# Patient Record
Sex: Female | Born: 1980 | Race: Asian | Hispanic: No | Marital: Married | Smoking: Never smoker
Health system: Southern US, Community
[De-identification: ages and names within clinical notes are randomized; demographics above are authoritative.]

## PROBLEM LIST (undated history)

## (undated) DIAGNOSIS — E119 Type 2 diabetes mellitus without complications: Secondary | ICD-10-CM

## (undated) DIAGNOSIS — O24419 Gestational diabetes mellitus in pregnancy, unspecified control: Secondary | ICD-10-CM

## (undated) DIAGNOSIS — O139 Gestational [pregnancy-induced] hypertension without significant proteinuria, unspecified trimester: Secondary | ICD-10-CM

## (undated) HISTORY — DX: Type 2 diabetes mellitus without complications: E11.9

---

## 2005-05-24 ENCOUNTER — Emergency Department (HOSPITAL_COMMUNITY): Admission: EM | Admit: 2005-05-24 | Discharge: 2005-05-24 | Payer: Self-pay | Admitting: Emergency Medicine

## 2008-12-03 ENCOUNTER — Inpatient Hospital Stay (HOSPITAL_COMMUNITY): Admission: AD | Admit: 2008-12-03 | Discharge: 2008-12-03 | Payer: Self-pay | Admitting: Obstetrics & Gynecology

## 2009-01-07 ENCOUNTER — Inpatient Hospital Stay (HOSPITAL_COMMUNITY): Admission: AD | Admit: 2009-01-07 | Discharge: 2009-01-11 | Payer: Self-pay | Admitting: Obstetrics and Gynecology

## 2009-01-08 ENCOUNTER — Encounter (INDEPENDENT_AMBULATORY_CARE_PROVIDER_SITE_OTHER): Payer: Self-pay | Admitting: Obstetrics & Gynecology

## 2009-04-15 ENCOUNTER — Inpatient Hospital Stay (HOSPITAL_COMMUNITY): Admission: AD | Admit: 2009-04-15 | Discharge: 2009-04-15 | Payer: Self-pay | Admitting: Obstetrics and Gynecology

## 2011-02-02 LAB — COMPREHENSIVE METABOLIC PANEL
ALT: 26 U/L (ref 0–35)
AST: 27 U/L (ref 0–37)
Albumin: 2.7 g/dL — ABNORMAL LOW (ref 3.5–5.2)
Alkaline Phosphatase: 231 U/L — ABNORMAL HIGH (ref 39–117)
BUN: 11 mg/dL (ref 6–23)
CO2: 22 mEq/L (ref 19–32)
Calcium: 9 mg/dL (ref 8.4–10.5)
Chloride: 104 mEq/L (ref 96–112)
Creatinine, Ser: 0.68 mg/dL (ref 0.4–1.2)
GFR calc Af Amer: >60 mL/min (ref >60)
GFR calc non Af Amer: >60 mL/min (ref >60)
Glucose, Bld: 85 mg/dL (ref 70–99)
Potassium: 3.8 mEq/L (ref 3.5–5.1)
Sodium: 134 mEq/L — ABNORMAL LOW (ref 135–145)
Total Bilirubin: 0.3 mg/dL (ref 0.3–1.2)
Total Protein: 6 g/dL (ref 6.0–8.3)

## 2011-02-02 LAB — RPR: RPR Ser Ql: NONREACTIVE

## 2011-02-02 LAB — CBC
HCT: 41.4 % (ref 36.0–46.0)
Hemoglobin: 13.8 g/dL (ref 12.0–15.0)
MCHC: 33.2 g/dL (ref 30.0–36.0)
MCHC: 33.6 g/dL (ref 30.0–36.0)
MCV: 89.5 fL (ref 78.0–100.0)
MCV: 89.8 fL (ref 78.0–100.0)
Platelets: 219 10*3/uL (ref 150–400)
RBC: 2.93 MIL/uL — ABNORMAL LOW (ref 3.87–5.11)
RBC: 4.53 MIL/uL (ref 3.87–5.11)
RBC: 4.63 MIL/uL (ref 3.87–5.11)
RDW: 14.8 % (ref 11.5–15.5)
WBC: 11.2 10*3/uL — ABNORMAL HIGH (ref 4.0–10.5)
WBC: 16 10*3/uL — ABNORMAL HIGH (ref 4.0–10.5)

## 2011-02-02 LAB — URIC ACID: Uric Acid, Serum: 6.4 mg/dL (ref 2.4–7.0)

## 2011-02-02 LAB — LACTATE DEHYDROGENASE: LDH: 208 U/L (ref 94–250)

## 2011-02-07 LAB — URINALYSIS, ROUTINE W REFLEX MICROSCOPIC
Glucose, UA: 100 mg/dL — AB
Ketones, ur: NEGATIVE mg/dL
pH: 8.5 — ABNORMAL HIGH (ref 5.0–8.0)

## 2011-02-07 LAB — URINE CULTURE
Colony Count: NO GROWTH
Culture: NO GROWTH

## 2011-03-07 NOTE — Op Note (Signed)
NAME:  Janice Case, Janice Case             ACCOUNT NO.:  [id number]   MEDICAL RECORD NO.:  [id number]          PATIENT TYPE:  INP   LOCATION:  9168                          FACILITY:  [facility]   PHYSICIAN:  Genia Del, M.D.DATE OF BIRTH:  02/16/1981   DATE OF PROCEDURE:  01/08/2009  DATE OF DISCHARGE:                               OPERATIVE REPORT   PREOPERATIVE DIAGNOSES:  39 weeks' gestation, arrest of descent in  second stage.   POSTOPERATIVE DIAGNOSES:  39 weeks' gestation, arrest of descent in  second stage.   PROCEDURE:  Urgent primary low transverse cesarean section.   SURGEON:  Genia Del, MD   ASSISTANT:  None.   ANESTHESIOLOGIST:  Quillian Quince, MD   PROCEDURE IN DETAIL:  Under spinal anesthesia, the patient is in 15-  degrees left decubitus position.  She was prepped with Betadine on the  abdominal, suprapubic, and vulvar areas.  The Foley is already in place  in the bladder.  We draped the patient as usual.  We verified the level  of anesthesia, which was adequate.  We infiltrated the subcutaneous  tissue with Marcaine 0.25% plain 10 mL.  We made a Pfannenstiel incision  with a scalpel, opened the adipose tissue with the electrocautery,  opened the aponeurosis transversely with the electrocautery and then  Mayo scissors.  We separated the aponeurosis from the recti muscles on  the midline, anteriorly, superiorly, and inferiorly.  We opened the  parietal peritoneum longitudinally with [facility] scissors.  We then put the  bladder retractor in place, opened the visceral peritoneum over the  lower uterine segment transversely, reclined the bladder downward,  repositioned the bladder retractor.  We then made a low transverse  hysterotomy with the scalpel.  We extended on each side with dressing  scissors.  The fetus was in cephalic presentation.  The amniotic fluid  was clear.  Birth of a baby girl at 4:33 a.m.  The cord was clamped and  cut.  The baby was suctioned  and given to the neonatal team.  Apgars  were 9 and 9, weight was 7 pounds and 15 ounces.  We extracted the  placenta, it was complete with 3 vessels, it was sent to Pathology.  We  did a uterine revision.  Pitocin was started in the IV fluid.  The  uterus contracts well.  Both ovaries and both tubes were normal to  inspection.  The uterus was also normal in size and appearance.  We  closed the hysterotomy with a first-locked running suture of Vicryl 0, a  small extension was present over the left aspect of the incision, and it  was repaired and then continued over the transverse part.  We then made  a second layer in a mattress fashion with Vicryl 0, that completes  hemostasis.  We then irrigated and suctioned the abdominopelvic  cavities.  We completed hemostasis on the recti muscles and over the  bladder area.  We then closed the aponeurosis with 2 half-running  sutures of Vicryl 0.  We completed the hemostasis on the adipose tissue  with the electrocautery and reapproximate  the skin with staples.  Dry  dressing was applied.  Note that the patient  received a dose of Ancef 1 g IV at the beginning of the intervention.  The estimated blood loss was 600 mL.  No complications occurred, and the  patient was brought to recovery room in good stable status.  The count  of instruments and sponges were complete x2.      Genia Del, M.D.  Electronically Signed     ML/MEDQ  D:  01/08/2009  T:  01/08/2009  Job:  [id number]

## 2011-03-07 NOTE — Discharge Summary (Signed)
NAME:  Janice Case, Janice Case             ACCOUNT NO.:  [id number]   MEDICAL RECORD NO.:  [id number]          PATIENT TYPE:  INP   LOCATION:  9108                          FACILITY:  [facility]   PHYSICIAN:  Genia Del, M.D.DATE OF BIRTH:  1981-06-18   DATE OF ADMISSION:  01/07/2009  DATE OF DISCHARGE:  01/11/2009                               DISCHARGE SUMMARY   ADMISSION DIAGNOSES:  1. Gravida 1, para 0 at 39 weeks intrauterine pregnancy.  2. Latent labor after spontaneous rupture of membranes.  3. Group B strep positive for mild preeclampsia.   DISCHARGE DIAGNOSES:  1. Gravida 1, para 1, primarily low transverse cesarean section,      postop day #3, term gestation.  2. Mild preeclampsia.  3. Acute blood loss anemia.   PROCEDURE:  Primary low-transverse cesarean section.   HISTORY OF PRESENT ILLNESS:  A 30 year old Asian female gravida 1, para  1-0-0-1, married at term presented in latent early labor after  spontaneous rupture of membranes.   HOSPITAL COURSE:  The patient was admitted.  She received group B strep  prophylaxis with penicillin 5 million units IV piggyback, then 2.5  million unit q.4 h. intrapartum.  Her mild preeclampsia did not require  the administration of magnesium sulfate in labor.  After pushing  approximately 3 hours with minimal descent,  she was taken to the  operating room for a primary low tranverse cesarean section.  She  delivered a live female with spontaneous respirations.  At 0433, Apgars  were 8 and 9.  The baby weight was 7 pounds and 15 ounces.  The patient  had an uncomplicated postoperative course.  She did not require  magnesium sulfate administration for the mild preeclampsia.  Her CBC on  postop day #1 showed a hemoglobin of 8.9, hematocrit of 26.3, platelet  count 157,000, white blood cell count 16.0.  By postop day #3, the  patient was doing well and ready to go home.   DISPOSITION:  To home.   CONDITION:  Stable.   DISCHARGE  MEDICATIONS:  1. Motrin 600 mg p.o. q.6 h. p.r.n.  2. Percocet 5/325 one to two tablets p.o. q. 4-6 h. p.r.n.  3. Colace 100 mg 1 tablet twice a day as needed.  4. Ferrous sulfate 325 mg p.o. 1 tablet daily.   FOLLOWUP APPOINTMENT:  At [facility] in 6 weeks.   DISCHARGE INSTRUCTIONS:  Per the postpartum booklet given.      Merrilee Jansky, CNM      Genia Del, M.D.  Electronically Signed    DL/MEDQ  D:  16/07/9603  T:  01/12/2009  Job:  [id number]

## 2012-06-18 ENCOUNTER — Ambulatory Visit (INDEPENDENT_AMBULATORY_CARE_PROVIDER_SITE_OTHER): Payer: Managed Care, Other (non HMO) | Admitting: Family Medicine

## 2012-06-18 VITALS — BP 103/69 | HR 73 | Temp 98.2°F | Resp 16 | Ht 58.25 in | Wt 118.2 lb

## 2012-06-18 DIAGNOSIS — R197 Diarrhea, unspecified: Secondary | ICD-10-CM

## 2012-06-18 DIAGNOSIS — K529 Noninfective gastroenteritis and colitis, unspecified: Secondary | ICD-10-CM

## 2012-06-18 DIAGNOSIS — R109 Unspecified abdominal pain: Secondary | ICD-10-CM

## 2012-06-18 DIAGNOSIS — K5289 Other specified noninfective gastroenteritis and colitis: Secondary | ICD-10-CM

## 2012-06-18 DIAGNOSIS — R1110 Vomiting, unspecified: Secondary | ICD-10-CM

## 2012-06-18 LAB — POCT URINE PREGNANCY: Preg Test, Ur: NEGATIVE

## 2012-06-18 LAB — POCT CBC
Granulocyte percent: 89.3 %G — AB (ref 37–80)
HCT, POC: 46.9 % (ref 37.7–47.9)
MCV: 90.5 fL (ref 80–97)
MID (cbc): 0.6 (ref 0–0.9)
Platelet Count, POC: 296 10*3/uL (ref 142–424)
RBC: 5.18 M/uL (ref 4.04–5.48)

## 2012-06-18 LAB — POCT URINALYSIS DIPSTICK
Leukocytes, UA: NEGATIVE
Protein, UA: 30
Urobilinogen, UA: 0.2

## 2012-06-18 LAB — POCT UA - MICROSCOPIC ONLY
Casts, Ur, LPF, POC: NEGATIVE
Crystals, Ur, HPF, POC: NEGATIVE

## 2012-06-18 MED ORDER — ONDANSETRON 4 MG PO TBDP: ORAL_TABLET | ORAL | Status: DC

## 2012-06-18 NOTE — Progress Notes (Signed)
Subjective: 31 year old lady from [place] who speaks good [place]. She got sick today and twice vomited and had diarrhea she's had no urinary pain. She hurts in her abdomen, it has let up a little bit right now. Her last menstrual cycle was July 2. 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 Married with one child. Her child has been sick with a gastroenteritis epistaxis. They went to the hospital yesterday.  Objective Results for orders placed in visit on 06/18/12  POCT UA - MICROSCOPIC ONLY      Component Value Range   WBC, Ur, HPF, POC 3-5     RBC, urine, microscopic 0-3     Bacteria, U Microscopic 4+     Mucus, UA neg     Epithelial cells, urine per micros 15-20     Crystals, Ur, HPF, POC neg     Casts, Ur, LPF, POC neg     Yeast, UA neg    POCT URINALYSIS DIPSTICK      Component Value Range   Color, UA dark yellow     Clarity, UA cloudy     Glucose, UA neg     Bilirubin, UA neg     Ketones, UA 15     Spec Grav, UA >=1.030     Blood, UA trace     pH, UA 5.5     Protein, UA 30     Urobilinogen, UA 0.2     Nitrite, UA neg     Leukocytes, UA Negative    POCT URINE PREGNANCY      Component Value Range   Preg Test, Ur Negative     HEENT negative eyes PERRLA. TMs normal. Throat clear. Neck supple without nodes. Chest clear. Heart regular without murmurs. The masses or tenderness tenderness. Diminished bowel sounds. Skin warm and dry. No major tenderness of the abdomen.  Assessment: Gastroenteritis with mild dehydration  Plan: Check CBC before deciding treatment. Results for orders placed in visit on 06/18/12  POCT UA - MICROSCOPIC ONLY      Component Value Range   WBC, Ur, HPF, POC 3-5     RBC, urine, microscopic 0-3     Bacteria, U Microscopic 4+     Mucus, UA neg     Epithelial cells, urine per micros 15-20     Crystals, Ur, HPF, POC neg     Casts, Ur, LPF, POC neg     Yeast, UA neg    POCT URINALYSIS DIPSTICK      Component Value Range   Color, UA dark yellow       Clarity, UA cloudy     Glucose, UA neg     Bilirubin, UA neg     Ketones, UA 15     Spec Grav, UA >=1.030     Blood, UA trace     pH, UA 5.5     Protein, UA 30     Urobilinogen, UA 0.2     Nitrite, UA neg     Leukocytes, UA Negative    POCT URINE PREGNANCY      Component Value Range   Preg Test, Ur Negative    POCT CBC      Component Value Range   WBC 15.2 (*) 4.6 - 10.2 K/uL   Lymph, poc 1.0  0.6 - 3.4   POC LYMPH PERCENT 6.7 (*) 10 - 50 %L   MID (cbc) 0.6  0 - 0.9   POC MID % 4.0  0 - 12 %  M   POC Granulocyte 13.6 (*) 2 - 6.9   Granulocyte percent 89.3 (*) 37 - 80 %G   RBC 5.18  4.04 - 5.48 M/uL   Hemoglobin 15.1  12.2 - 16.2 g/dL   HCT, POC 16.1  09.6 - 47.9 %   MCV 90.5  80 - 97 fL   MCH, POC 29.2  27 - 31.2 pg   MCHC 32.2  31.8 - 35.4 g/dL   RDW, POC 04.5     Platelet Count, POC 296  142 - 424 K/uL   MPV 9.8  0 - 99.8 fL   Probably has leukocytosis from the gastro.  Her son was also sick.  However, will caution to go to ER or return if worse.

## 2012-06-18 NOTE — Progress Notes (Deleted)
Subjective

## 2012-06-18 NOTE — Patient Instructions (Signed)
Take the medicine for nausea and vomiting if necessary.  If you develop worse abdominal pain, high fever or bad vomiting go to the emergency room.  If you are not improving by tomorrow  come back for a recheck and repeat blood test.

## 2013-03-15 ENCOUNTER — Encounter (HOSPITAL_COMMUNITY): Payer: Self-pay | Admitting: *Deleted

## 2013-03-15 ENCOUNTER — Inpatient Hospital Stay (HOSPITAL_COMMUNITY)
Admission: AD | Admit: 2013-03-15 | Discharge: 2013-03-15 | Disposition: A | Discharge status: Home / Self Care | Payer: Managed Care, Other (non HMO) | Source: Ambulatory Visit | Attending: Obstetrics & Gynecology | Admitting: Obstetrics & Gynecology

## 2013-03-15 DIAGNOSIS — O99891 Other specified diseases and conditions complicating pregnancy: Secondary | ICD-10-CM | POA: Insufficient documentation

## 2013-03-15 DIAGNOSIS — Y92009 Unspecified place in unspecified non-institutional (private) residence as the place of occurrence of the external cause: Secondary | ICD-10-CM | POA: Insufficient documentation

## 2013-03-15 DIAGNOSIS — W010XXA Fall on same level from slipping, tripping and stumbling without subsequent striking against object, initial encounter: Secondary | ICD-10-CM | POA: Insufficient documentation

## 2013-03-15 LAB — POCT PREGNANCY, URINE: Preg Test, Ur: POSITIVE — AB

## 2013-03-15 NOTE — MAU Note (Addendum)
Pt slipped and fell on her buttocks today. Worried about baby. Denies any pain or bleeding. Did not hit stomach. Has first prenatal appointment end of this month.

## 2013-03-15 NOTE — MAU Provider Note (Signed)
  History     CSN: [id number]  Arrival date and time: 03/15/13 1325   First Provider Initiated Contact with Patient 03/15/13 1514      Chief Complaint  Patient presents with  . Fall   HPI Janice Case 32 y.o. LMP 01-15-13.  8w 3d  Client has an appointment to begin prenatal care at [facility].  Medicaid is pending.  Today slipped on a slick floor at home and caught herself with her hands.  Buttocks did not actually hit the floor.  Denies any abdominal pain.  No vaginal bleeding or leaking.  Came to make sure the baby is OK.  OB History   Grav Para Term Preterm Abortions TAB SAB Ect Mult Living   2 1 1       1       Past Medical History  Diagnosis Date  . Medical history non-contributory     Past Surgical History  Procedure Laterality Date  . Cesarean section      No family history on file.  History  Substance Use Topics  . Smoking status: Never Smoker   . Smokeless tobacco: Not on file  . Alcohol Use: No    Allergies: No Known Allergies  Prescriptions prior to admission  Medication Sig Dispense Refill  . Prenatal Vit-Fe Fumarate-FA (PRENATAL MULTIVITAMIN) TABS Take 1 tablet by mouth daily at 12 noon.        Review of Systems  Constitutional: Negative for fever.  Gastrointestinal: Negative for nausea, vomiting and abdominal pain.  Genitourinary: Negative for dysuria.       No leaking or bleeding   Physical Exam   Blood pressure 104/66, pulse 72, temperature 98.7 F (37.1 C), temperature source Oral, resp. rate 18, height 4\' 10"  (1.473 m), weight 117 lb 9.6 oz (53.343 kg), last menstrual period 01/15/2013.  Physical Exam  Nursing note and vitals reviewed. Constitutional: She is oriented to person, place, and time. She appears well-developed and well-nourished.  HENT:  Head: Normocephalic.  Eyes: EOM are normal.  Neck: Neck supple.  Musculoskeletal: Normal range of motion.  Neurological: She is alert and oriented to person, place, and time.  Skin:  Skin is warm and dry.  Psychiatric: She has a normal mood and affect.    MAU Course  Procedures Results for orders placed during the hospital encounter of 03/15/13 (from the past 24 hour(s))  POCT PREGNANCY, URINE     Status: Abnormal   Collection Time    03/15/13  2:11 PM      Result Value Range   Preg Test, Ur POSITIVE (*) NEGATIVE   MDM Discussed with client that she has no signs of a problem with the pregnancy.  Since she did not actually fall and hit hard, ultrasound is not indicated today.  Assessment and Plan  Near fall at home  Plan Keep appointment for prenatal care. Return if you develop abdominal pain or bleeding.  BURLESON,TERRI 03/15/2013, 3:24 PM

## 2013-03-21 ENCOUNTER — Other Ambulatory Visit: Payer: Self-pay

## 2013-04-11 LAB — OB RESULTS CONSOLE HIV ANTIBODY (ROUTINE TESTING): HIV: NONREACTIVE

## 2013-04-11 LAB — OB RESULTS CONSOLE ABO/RH: RH Type: POSITIVE

## 2013-04-11 LAB — OB RESULTS CONSOLE HEPATITIS B SURFACE ANTIGEN: Hepatitis B Surface Ag: NEGATIVE

## 2013-04-11 LAB — OB RESULTS CONSOLE ANTIBODY SCREEN: Antibody Screen: NEGATIVE

## 2013-04-11 LAB — OB RESULTS CONSOLE RUBELLA ANTIBODY, IGM: Rubella: IMMUNE

## 2013-06-05 ENCOUNTER — Emergency Department (HOSPITAL_COMMUNITY)
Admission: EM | Admit: 2013-06-05 | Discharge: 2013-06-05 | Disposition: A | Discharge status: Home / Self Care | Payer: Managed Care, Other (non HMO) | Attending: Emergency Medicine | Admitting: Emergency Medicine

## 2013-06-05 ENCOUNTER — Encounter (HOSPITAL_COMMUNITY): Payer: Self-pay | Admitting: Emergency Medicine

## 2013-06-05 DIAGNOSIS — R079 Chest pain, unspecified: Secondary | ICD-10-CM | POA: Insufficient documentation

## 2013-06-05 DIAGNOSIS — O9989 Other specified diseases and conditions complicating pregnancy, childbirth and the puerperium: Secondary | ICD-10-CM | POA: Insufficient documentation

## 2013-06-05 DIAGNOSIS — K2970 Gastritis, unspecified, without bleeding: Secondary | ICD-10-CM | POA: Insufficient documentation

## 2013-06-05 DIAGNOSIS — R112 Nausea with vomiting, unspecified: Secondary | ICD-10-CM | POA: Insufficient documentation

## 2013-06-05 DIAGNOSIS — M549 Dorsalgia, unspecified: Secondary | ICD-10-CM | POA: Insufficient documentation

## 2013-06-05 DIAGNOSIS — Z79899 Other long term (current) drug therapy: Secondary | ICD-10-CM | POA: Insufficient documentation

## 2013-06-05 DIAGNOSIS — R109 Unspecified abdominal pain: Secondary | ICD-10-CM | POA: Insufficient documentation

## 2013-06-05 DIAGNOSIS — K2990 Gastroduodenitis, unspecified, without bleeding: Secondary | ICD-10-CM | POA: Insufficient documentation

## 2013-06-05 DIAGNOSIS — R0602 Shortness of breath: Secondary | ICD-10-CM | POA: Insufficient documentation

## 2013-06-05 LAB — BASIC METABOLIC PANEL
CO2: 24 mEq/L (ref 19–32)
Calcium: 8.8 mg/dL (ref 8.4–10.5)
Creatinine, Ser: 0.56 mg/dL (ref 0.50–1.10)
Glucose, Bld: 131 mg/dL — ABNORMAL HIGH (ref 70–99)
Sodium: 134 mEq/L — ABNORMAL LOW (ref 135–145)

## 2013-06-05 LAB — CBC
MCV: 87.4 fL (ref 78.0–100.0)
Platelets: 210 10*3/uL (ref 150–400)
RBC: 3.97 MIL/uL (ref 3.87–5.11)
WBC: 9.4 10*3/uL (ref 4.0–10.5)

## 2013-06-05 LAB — HEPATIC FUNCTION PANEL
AST: 16 U/L (ref 0–37)
Albumin: 2.8 g/dL — ABNORMAL LOW (ref 3.5–5.2)
Total Bilirubin: 0.5 mg/dL (ref 0.3–1.2)

## 2013-06-05 LAB — POCT I-STAT TROPONIN I: Troponin i, poc: 0 ng/mL (ref 0.00–0.08)

## 2013-06-05 MED ORDER — CALCIUM CARBONATE ANTACID 500 MG PO CHEW: 1.0000 | CHEWABLE_TABLET | Freq: Every day | ORAL | Status: DC

## 2013-06-05 MED ORDER — ONDANSETRON 4 MG PO TBDP
8.0000 mg | ORAL_TABLET | Freq: Once | ORAL | Status: AC
  Administered 2013-06-05: 8 mg via ORAL
  Filled 2013-06-05: qty 2

## 2013-06-05 MED ORDER — FAMOTIDINE 20 MG PO TABS
40.0000 mg | ORAL_TABLET | Freq: Once | ORAL | Status: AC
  Administered 2013-06-05: 40 mg via ORAL
  Filled 2013-06-05: qty 2

## 2013-06-05 MED ORDER — PRENATAL COMPLETE 14-0.4 MG PO TABS: 1.0000 | ORAL_TABLET | Freq: Once | ORAL | Status: DC

## 2013-06-05 MED ORDER — FAMOTIDINE 20 MG PO TABS: 20.0000 mg | ORAL_TABLET | Freq: Two times a day (BID) | ORAL | Status: DC

## 2013-06-05 NOTE — ED Provider Notes (Signed)
CSN: [id number]     Arrival date & time 06/05/13  0244 History     First MD Initiated Contact with Patient 06/05/13 [id number]     Chief Complaint  Patient presents with  . chest pain    HPI  History provided by the patient. The patient is a 32 year old female G2P1 currently 5 months pregnant who presents with complaints of persistent and worsened epigastric and substernal chest discomforts. Patient first began having some pain and discomfort yesterday evening around 6 PM. He seemed to increase through the evening and was associated with one episode of vomiting. Pain seems worse at times with deep breathing. She denies any shortness of breath however. She did not take any medications or treatment for symptoms. She denies any increased belching. She denies any lower abdominal pain, vaginal bleeding or vaginal discharge. No urinary symptoms, no dysuria, hematuria urinary frequency. No diarrhea or constipation. No recent fever, chills or sweats. No coughing or URI symptoms. Patient states she has upcoming appointment with OB/GYN on Friday.    Past Medical History  Diagnosis Date  . Medical history non-contributory    Past Surgical History  Procedure Laterality Date  . Cesarean section     No family history on file. History  Substance Use Topics  . Smoking status: Never Smoker   . Smokeless tobacco: Not on file  . Alcohol Use: No   OB History   Grav Para Term Preterm Abortions TAB SAB Ect Mult Living   3 1 1       1      Review of Systems  Constitutional: Negative for fever, chills and diaphoresis.  Respiratory: Negative for cough and shortness of breath.   Cardiovascular: Positive for chest pain.  Gastrointestinal: Positive for nausea and vomiting. Negative for abdominal pain, diarrhea and constipation.  Genitourinary: Negative for dysuria, frequency, hematuria, vaginal bleeding, vaginal discharge and pelvic pain.  All other systems reviewed and are negative.    Allergies  Review  of patient's allergies indicates no known allergies.  Home Medications   Current Outpatient Rx  Name  Route  Sig  Dispense  Refill  . Prenatal Vit-Fe Fumarate-FA (PRENATAL MULTIVITAMIN) TABS   Oral   Take 1 tablet by mouth daily at 12 noon.          BP 125/89  Pulse 92  Temp(Src) 98.9 F (37.2 C) (Oral)  Resp 18  SpO2 100%  LMP 01/15/2013 Physical Exam  Nursing note and vitals reviewed. Constitutional: She is oriented to person, place, and time. She appears well-developed and well-nourished. No distress.  HENT:  Head: Normocephalic.  Cardiovascular: Normal rate and regular rhythm.   No murmur heard. Pulmonary/Chest: Effort normal and breath sounds normal. No respiratory distress. She has no wheezes. She has no rales. She exhibits no tenderness.  Abdominal: Soft. There is no tenderness.  Gravid. No tenderness. Bedside ultrasound shows well-developing fetus with normal heart rate and activity.  Musculoskeletal: Normal range of motion. She exhibits no edema and no tenderness.  Neurological: She is alert and oriented to person, place, and time.  Skin: Skin is warm and dry. No rash noted.  Psychiatric: She has a normal mood and affect. Her behavior is normal.    ED Course   Procedures   Results for orders placed during the hospital encounter of 06/05/13  CBC      Result Value Range   WBC 9.4  4.0 - 10.5 K/uL   RBC 3.97  3.87 - 5.11 MIL/uL   Hemoglobin  12.5  12.0 - 15.0 g/dL   HCT 40.9 (*) 81.1 - 91.4 %   MCV 87.4  78.0 - 100.0 fL   MCH 31.5  26.0 - 34.0 pg   MCHC 36.0  30.0 - 36.0 g/dL   RDW 78.2  95.6 - 21.3 %   Platelets 210  150 - 400 K/uL  BASIC METABOLIC PANEL      Result Value Range   Sodium 134 (*) 135 - 145 mEq/L   Potassium 3.7  3.5 - 5.1 mEq/L   Chloride 100  96 - 112 mEq/L   CO2 24  19 - 32 mEq/L   Glucose, Bld 131 (*) 70 - 99 mg/dL   BUN 9  6 - 23 mg/dL   Creatinine, Ser 0.86  0.50 - 1.10 mg/dL   Calcium 8.8  8.4 - 57.8 mg/dL   GFR calc non Af Amer  >90  >90 mL/min   GFR calc Af Amer >90  >90 mL/min  PRO B NATRIURETIC PEPTIDE      Result Value Range   Pro B Natriuretic peptide (BNP) 94.0  0 - 125 pg/mL  POCT I-STAT TROPONIN I      Result Value Range   Troponin i, poc 0.00  0.00 - 0.08 ng/mL   Comment 3                   1. Gastritis        MDM  4:30 AM patient seen and evaluated. Patient resting in bed appears comfortable no acute distress.  Bedside ultrasound shows normal fetal heart movements. Gallbladder not well visualized.  Patient discussed in sign out with Wylene Simmer PA-C.  She will follow hepatic function panel. Suspect gastritis and heartburn at this time. Patient appears well without any significant tenderness. Normal respirations O2 sats and heart rate. No clinical concerns for PE.   Date: 06/05/2013  Rate: 86  Rhythm: normal sinus rhythm  QRS Axis: normal  Intervals: normal  ST/T Wave abnormalities: normal  Conduction Disutrbances:none  Narrative Interpretation:   Old EKG Reviewed: none available    Angus Seller, PA-C 06/05/13 [phone]

## 2013-06-05 NOTE — ED Provider Notes (Signed)
Medical screening examination/treatment/procedure(s) were performed by non-physician practitioner and as supervising physician I was immediately available for consultation/collaboration.  John-Adam Briea Mcenery, M.D.     John-Adam Elke Holtry, MD 06/05/13 0635 

## 2013-06-05 NOTE — ED Notes (Signed)
Pt is 5 months pregnant and having pain in her chest that radiates to her back and in her upper abdomen.pt states the pain increases after she eats. Pt denies N/V, SOB or diaphoresis. Pt alert and oriented. Pt has been to [facility] throughout pregnancy has another appointment Friday.

## 2013-06-05 NOTE — ED Notes (Signed)
PT. REPORTS MID CHEST PAIN AND MID BACK PAIN ONSET THIS EVENING WITH SLIGHT SOB AND EMESIS ( X1) , PT. STATED SHE IS 5 MONTHS PREGNANT -G2P1 .

## 2013-06-19 ENCOUNTER — Inpatient Hospital Stay (HOSPITAL_COMMUNITY): Payer: Managed Care, Other (non HMO)

## 2013-06-19 ENCOUNTER — Encounter (HOSPITAL_COMMUNITY): Payer: Self-pay

## 2013-06-19 ENCOUNTER — Inpatient Hospital Stay (HOSPITAL_COMMUNITY)
Admission: AD | Admit: 2013-06-19 | Discharge: 2013-06-19 | Disposition: A | Discharge status: Home / Self Care | Payer: Managed Care, Other (non HMO) | Source: Ambulatory Visit | Attending: Emergency Medicine | Admitting: Emergency Medicine

## 2013-06-19 DIAGNOSIS — W010XXA Fall on same level from slipping, tripping and stumbling without subsequent striking against object, initial encounter: Secondary | ICD-10-CM | POA: Insufficient documentation

## 2013-06-19 DIAGNOSIS — O99891 Other specified diseases and conditions complicating pregnancy: Secondary | ICD-10-CM | POA: Insufficient documentation

## 2013-06-19 DIAGNOSIS — Z3490 Encounter for supervision of normal pregnancy, unspecified, unspecified trimester: Secondary | ICD-10-CM

## 2013-06-19 DIAGNOSIS — R42 Dizziness and giddiness: Secondary | ICD-10-CM | POA: Insufficient documentation

## 2013-06-19 DIAGNOSIS — R51 Headache: Secondary | ICD-10-CM | POA: Insufficient documentation

## 2013-06-19 DIAGNOSIS — Y9229 Other specified public building as the place of occurrence of the external cause: Secondary | ICD-10-CM | POA: Insufficient documentation

## 2013-06-19 DIAGNOSIS — S0003XA Contusion of scalp, initial encounter: Secondary | ICD-10-CM

## 2013-06-19 DIAGNOSIS — W19XXXA Unspecified fall, initial encounter: Secondary | ICD-10-CM

## 2013-06-19 LAB — URINE MICROSCOPIC-ADD ON

## 2013-06-19 LAB — URINALYSIS, ROUTINE W REFLEX MICROSCOPIC
Glucose, UA: NEGATIVE mg/dL
Ketones, ur: NEGATIVE mg/dL
Specific Gravity, Urine: 1.02 (ref 1.005–1.030)
pH: 7 (ref 5.0–8.0)

## 2013-06-19 NOTE — ED Provider Notes (Signed)
CSN: [id number]     Arrival date & time 06/19/13  1208 History   First MD Initiated Contact with Patient 06/19/13 1501     Chief Complaint  Patient presents with  . Fall   (Consider location/radiation/quality/duration/timing/severity/associated sxs/prior Treatment) Patient is a 32 y.o. female presenting with fall. The history is provided by the patient.  Fall This is a new problem. Associated symptoms include headaches. Pertinent negatives include no chest pain, no abdominal pain and no shortness of breath.   patient is [redacted] weeks pregnant and was transferred from his hospital. She had a syncopal episode and fell striking the back of her head. No chest pain. She has a headache. She [facility] No abdominal pain. No confusion. She states she does get occasional lightheadedness. No difficulty breathing. No swelling or legs. She states she had a similar episode of passing out in May.  Past Medical History  Diagnosis Date  . Medical history non-contributory    Past Surgical History  Procedure Laterality Date  . Cesarean section     History reviewed. No pertinent family history. History  Substance Use Topics  . Smoking status: Never Smoker   . Smokeless tobacco: Not on file  . Alcohol Use: No   OB History   Grav Para Term Preterm Abortions TAB SAB Ect Mult Living   2 1 1       1      Review of Systems  Constitutional: Negative for activity change and appetite change.  HENT: Negative for neck stiffness.   Eyes: Negative for pain.  Respiratory: Negative for chest tightness and shortness of breath.   Cardiovascular: Negative for chest pain and leg swelling.  Gastrointestinal: Negative for nausea, vomiting, abdominal pain and diarrhea.  Genitourinary: Negative for flank pain.       Pregnant  Musculoskeletal: Negative for back pain.  Skin: Negative for rash.  Neurological: Positive for syncope, light-headedness and headaches. Negative for  weakness and numbness.  Psychiatric/Behavioral: Negative for behavioral problems.    Allergies  Review of patient's allergies indicates no known allergies.  Home Medications   Current Outpatient Rx  Name  Route  Sig  Dispense  Refill  . Prenatal Vit-Fe Fumarate-FA (PRENATAL MULTIVITAMIN) TABS   Oral   Take 1 tablet by mouth daily at 12 noon.          BP 101/63  Pulse 76  Temp(Src) 98.5 F (36.9 C) (Oral)  Resp 20  Ht 4\' 10"  (1.473 m)  Wt 124 lb 3.2 oz (56.337 kg)  BMI 25.96 kg/m2  SpO2 98%  LMP 01/15/2013 Physical Exam  Nursing note and vitals reviewed. Constitutional: She is oriented to person, place, and time. She appears well-developed and well-nourished.  HENT:  Head: Normocephalic.  Tender to right occipital area. No step-off or deformity. No crepitance.  Eyes: EOM are normal. Pupils are equal, round, and reactive to light.  Neck: Normal range of motion. Neck supple.  Cardiovascular: Normal rate, regular rhythm and normal heart sounds.   No murmur heard. Pulmonary/Chest: Effort normal and breath sounds normal. No respiratory distress. She has no wheezes. She has no rales.  Abdominal: Soft. Bowel sounds are normal. She exhibits mass. She exhibits no distension. There is no tenderness. There is no rebound and no guarding.   gravid to above umbilicus  Musculoskeletal: Normal range of motion.  Neurological: She is alert and oriented to person, place, and time. No cranial nerve deficit.  Skin: Skin is warm  and dry.  Psychiatric: She has a normal mood and affect. Her speech is normal.    ED Course  Procedures (including critical care time) Labs Review Labs Reviewed  URINALYSIS, ROUTINE W REFLEX MICROSCOPIC - Abnormal; Notable for the following:    Leukocytes, UA SMALL (*)    All other components within normal limits  URINE MICROSCOPIC-ADD ON - Abnormal; Notable for the following:    Squamous Epithelial / LPF FEW (*)    Bacteria, UA FEW (*)    All other  components within normal limits  URINE CULTURE   Imaging Review No results found.  Date: 06/24/2013  Rate: 82  Rhythm: normal sinus rhythm  QRS Axis: normal  Intervals: normal  ST/T Wave abnormalities: normal  Conduction Disutrbances: none  Narrative Interpretation: unremarkable     MDM   1. Fall, initial encounter   2. Scalp contusion, initial encounter   3. Pregnant    Patient with syncope. Seen at [facility] hospital and transferred here. CT reassuring. No UTI. EKG reassuring. Discharge home not orthostatic    Juliet Rude. Rubin Payor, MD 06/24/13 1441

## 2013-06-19 NOTE — ED Notes (Addendum)
Pt from [place], [redacted] weeks pregnant. Dr wanted pt to come over here for evaluation for head. Pt was and work, fell from lightheadedness and hit head on table, little redness to spot in back of head but no bleeding. [facility]. No LOC to memory. Pt denies dizziness or lightheadedness at the moment.

## 2013-06-19 NOTE — MAU Provider Note (Signed)
  History     CSN: [id number]  Arrival date and time: 06/19/13 1208   First Provider Initiated Contact with Patient 06/19/13 1307      Chief Complaint  Patient presents with  . Fall   HPI  Pt is a G2P1001 at 22.1 wks IUP with report of falling at work.  Does not recall how the fall occurred but remembers hitting floor backwards and coming to with staff members encircling her.  No report of abdominal pain or vaginal bleeding.  +fetal movement.  Denies dizziness or change in mentation.  +occipital headache and pain with touch.    Past Medical History  Diagnosis Date  . Medical history non-contributory     Past Surgical History  Procedure Laterality Date  . Cesarean section      History reviewed. No pertinent family history.  History  Substance Use Topics  . Smoking status: Never Smoker   . Smokeless tobacco: Not on file  . Alcohol Use: No    Allergies: No Known Allergies  Prescriptions prior to admission  Medication Sig Dispense Refill  . Prenatal Vit-Fe Fumarate-FA (PRENATAL MULTIVITAMIN) TABS Take 1 tablet by mouth daily at 12 noon.        Review of Systems  HENT: Negative for nosebleeds.   Eyes: Negative for blurred vision and double vision.  Gastrointestinal: Negative for abdominal pain.  Genitourinary:       Negative abdominal pain  Neurological: Positive for loss of consciousness and headaches (occipital). Negative for dizziness, speech change and focal weakness.  Psychiatric/Behavioral: Negative for memory loss.   Physical Exam   Blood pressure 111/65, pulse 82, temperature 98.5 F (36.9 C), temperature source Oral, resp. rate 16, height 4\' 10"  (1.473 m), weight 56.337 kg (124 lb 3.2 oz), last menstrual period 01/15/2013, SpO2 98.00%.  Physical Exam  Constitutional: She is oriented to person, place, and time. She appears well-developed and well-nourished. No distress.  HENT:  Head: Normocephalic. Head is with contusion. Head is without raccoon's eyes  and without Battle's sign.    Eyes: EOM are normal. Pupils are equal, round, and reactive to light.  Neck: Normal range of motion. Neck supple.  Cardiovascular: Normal rate, regular rhythm and normal heart sounds.   Respiratory: Effort normal and breath sounds normal.  GI: Soft. There is no tenderness.  Genitourinary: No bleeding around the vagina.  Musculoskeletal: Normal range of motion.  Neurological: She is alert and oriented to person, place, and time.  Skin: Skin is warm and dry.    MAU Course  Procedures  0135 Consulted with Dr. Henderson Cloud > Reviewed HPI/Exam/> transfer to [facility] for evaluation of head injury.   60 Contacted Dr. Daiva Huge and gave pt report > accepted transfer to [facility]. Assessment and Plan    [facility] 06/19/2013, 1:27 PM

## 2013-06-19 NOTE — ED Notes (Signed)
Bed: ZO10 Expected date:  Expected time:  Means of arrival:  Comments: ems - sent from [facility], 22 weeks preg, hit her head

## 2013-06-19 NOTE — MAU Provider Note (Signed)
FHTs were present at 148 by nurse.

## 2013-06-19 NOTE — MAU Note (Signed)
Patient states she was standing at a metal table working and fell down. Does not remember falling but was on the floor. States she hit the back of her head and is having pain on the back of her head, not sure if hit abdomen but not having any abdominal pain. Denies bleeding or discharge.

## 2013-06-19 NOTE — MAU Note (Signed)
Patient is in with c/o headache, occiput tenderness after falling and hitting head at work. Patient does not recall preceding circumstance. She thinks that she fainted. Her abdomen is soft and non-tender. Denies vaginal bleeding or lof. Reports good fetal movement. Patient states that she is unable to drink enough water at work. She stands at work, working 10-12 hours a day between 5-6 days a week.

## 2013-06-20 LAB — URINE CULTURE: Culture: NO GROWTH

## 2013-07-31 LAB — OB RESULTS CONSOLE RPR: RPR: NONREACTIVE

## 2013-08-07 ENCOUNTER — Encounter: Payer: Managed Care, Other (non HMO) | Attending: Obstetrics and Gynecology | Admitting: *Deleted

## 2013-08-07 ENCOUNTER — Encounter: Payer: Self-pay | Admitting: *Deleted

## 2013-08-07 VITALS — Ht 60.0 in | Wt 135.0 lb

## 2013-08-07 DIAGNOSIS — O24419 Gestational diabetes mellitus in pregnancy, unspecified control: Secondary | ICD-10-CM

## 2013-08-07 DIAGNOSIS — Z713 Dietary counseling and surveillance: Secondary | ICD-10-CM | POA: Insufficient documentation

## 2013-08-07 DIAGNOSIS — O99810 Abnormal glucose complicating pregnancy: Secondary | ICD-10-CM | POA: Insufficient documentation

## 2013-08-07 NOTE — Progress Notes (Addendum)
  Patient was seen on 08/07/13 for Gestational Diabetes self-management education at the [facility]. The following learning objectives were met by the patient during this appointment:   States the definition of Gestational Diabetes  States why dietary management is important in controlling blood glucose  Describes the effects each nutrient has on blood glucose levels  Demonstrates ability to create a balanced meal plan  Demonstrates carbohydrate counting   States when to check blood glucose levels  Demonstrates proper blood glucose monitoring techniques  States the effect of stress and exercise on blood glucose levels  States the importance of limiting caffeine and abstaining from alcohol and smoking  Blood glucose monitor given: Accu Chek Nano BG Monitoring Kit Lot # [id number] Exp: 09/21/14 Blood glucose reading: 98 mg/dl preprandial  Patient instructed to monitor glucose levels: FBS: 60 - <90 2 hour: <120  *Patient received handouts:  Nutrition Diabetes and Pregnancy in [place]  Carbohydrate Counting List  Patient will be seen for follow-up as needed.

## 2013-09-01 ENCOUNTER — Inpatient Hospital Stay (HOSPITAL_COMMUNITY)
Admission: AD | Admit: 2013-09-01 | Discharge: 2013-09-01 | Disposition: A | Discharge status: Home / Self Care | Payer: Managed Care, Other (non HMO) | Source: Ambulatory Visit | Attending: Obstetrics & Gynecology | Admitting: Obstetrics & Gynecology

## 2013-09-01 ENCOUNTER — Encounter (HOSPITAL_COMMUNITY): Payer: Self-pay | Admitting: *Deleted

## 2013-09-01 DIAGNOSIS — O4700 False labor before 37 completed weeks of gestation, unspecified trimester: Secondary | ICD-10-CM | POA: Insufficient documentation

## 2013-09-01 DIAGNOSIS — O479 False labor, unspecified: Secondary | ICD-10-CM

## 2013-09-01 NOTE — MAU Note (Signed)
Good Fetal movement, contractions, no bleeding

## 2013-09-01 NOTE — MAU Provider Note (Signed)
  History     CSN: [id number]  Arrival date and time: 09/01/13 2130   First Provider Initiated Contact with Patient 09/01/13 0809      Chief Complaint  Patient presents with  . Contractions   HPI Janice Case is a 32 y.o. G2P1001 at [redacted]w[redacted]d who presents to MAU today with complaint of contractions since midnight last night. The patient states that they were very painful overnight and she was unable to sleep. She feels that they are mild now in comparison, but she is still having them occasionally. She states she only has pain with contractions and fetal movement. She denies vaginal bleeding, discharge, LOF, fever, N/V/D or constipation. She reports good fetal movement.   OB History   Grav Para Term Preterm Abortions TAB SAB Ect Mult Living   2 1 1       1       Past Medical History  Diagnosis Date  . Medical history non-contributory   . Diabetes mellitus without complication     Past Surgical History  Procedure Laterality Date  . Cesarean section      No family history on file.  History  Substance Use Topics  . Smoking status: Never Smoker   . Smokeless tobacco: Not on file  . Alcohol Use: No    Allergies: No Known Allergies  Prescriptions prior to admission  Medication Sig Dispense Refill  . Prenatal Vit-Fe Fumarate-FA (PRENATAL MULTIVITAMIN) TABS Take 1 tablet by mouth daily at 12 noon.        Review of Systems  Constitutional: Negative for fever and malaise/fatigue.  Gastrointestinal: Positive for abdominal pain. Negative for nausea, vomiting, diarrhea and constipation.  Genitourinary: Negative for dysuria, urgency and frequency.       Neg - vaginal discharge, bleeding, LOF   Physical Exam   Blood pressure 100/61, pulse 84, resp. rate 16, height 5' (1.524 m), weight 136 lb (61.689 kg), last menstrual period 01/15/2013.  Physical Exam  Constitutional: She is oriented to person, place, and time. She appears well-developed and well-nourished. No  distress.  HENT:  Head: Normocephalic and atraumatic.  Cardiovascular: Normal rate, regular rhythm and normal heart sounds.   Respiratory: Effort normal and breath sounds normal. No respiratory distress.  GI: Soft. Bowel sounds are normal. She exhibits no distension and no mass. There is no tenderness. There is no rebound and no guarding.  Neurological: She is alert and oriented to person, place, and time.  Skin: Skin is warm and dry. No erythema.  Psychiatric: She has a normal mood and affect.  Dilation: Closed Station: -3 Presentation: Vertex Exam by:: Morrison Old RN  Fetal Monitoring: Baseline: 125 bpm, moderate variability, + accelerations, no decelerations Contractions: None MAU Course  Procedures None  MDM Discussed patient with Dr. Arlyce Dice. Ok for discharge. Follow-up in the office as scheduled.    Assessment and Plan  A: Braxton-Hicks contractions  P: Discharge home Labor precautions discussed Patient advised to follow-up in the office as scheduled Patient may return to MAU as needed or if her condition were to change or worsen  Freddi Starr, PA-C 09/01/2013, 9:44 AM

## 2013-09-01 NOTE — MAU Note (Signed)
Started UC at midnight pain 10/10 during night, no sleep, less this AM, worried and came to MAU

## 2013-09-24 ENCOUNTER — Encounter (HOSPITAL_COMMUNITY): Payer: Self-pay | Admitting: *Deleted

## 2013-09-24 ENCOUNTER — Inpatient Hospital Stay (HOSPITAL_COMMUNITY)
Admission: AD | Admit: 2013-09-24 | Discharge: 2013-09-24 | Disposition: A | Discharge status: Home / Self Care | Payer: Managed Care, Other (non HMO) | Source: Ambulatory Visit | Attending: Obstetrics and Gynecology | Admitting: Obstetrics and Gynecology

## 2013-09-24 DIAGNOSIS — O479 False labor, unspecified: Secondary | ICD-10-CM

## 2013-09-24 DIAGNOSIS — R109 Unspecified abdominal pain: Secondary | ICD-10-CM | POA: Insufficient documentation

## 2013-09-24 DIAGNOSIS — O4700 False labor before 37 completed weeks of gestation, unspecified trimester: Secondary | ICD-10-CM | POA: Insufficient documentation

## 2013-09-24 HISTORY — DX: Gestational diabetes mellitus in pregnancy, unspecified control: O24.419

## 2013-09-24 LAB — URINALYSIS, ROUTINE W REFLEX MICROSCOPIC
Bilirubin Urine: NEGATIVE
Glucose, UA: NEGATIVE mg/dL
Hgb urine dipstick: NEGATIVE
Protein, ur: NEGATIVE mg/dL
Urobilinogen, UA: 0.2 mg/dL (ref 0.0–1.0)

## 2013-09-24 LAB — HEMOGLOBIN A1C
Hgb A1c MFr Bld: 6.4 % — ABNORMAL HIGH (ref <5.7)
Mean Plasma Glucose: 137 mg/dL — ABNORMAL HIGH (ref <117)

## 2013-09-24 LAB — GLUCOSE, CAPILLARY: Glucose-Capillary: 97 mg/dL (ref 70–99)

## 2013-09-24 LAB — URINE MICROSCOPIC-ADD ON

## 2013-09-24 LAB — OB RESULTS CONSOLE GC/CHLAMYDIA: Chlamydia: NEGATIVE

## 2013-09-24 LAB — WET PREP, GENITAL

## 2013-09-24 NOTE — MAU Provider Note (Signed)
History     CSN: [id number]  Arrival date and time: 09/24/13 7846   First Provider Initiated Contact with Patient 09/24/13 [phone]      Chief Complaint  Patient presents with  . Abdominal Pain   HPI Comments: Janice Case 32 y.o. G2P1001 presents to MAU for abdominal pains since 2 am. She feels she is having contractions off and on. + fetal movement, no ROM, no bleeding. She missed her last appointment at [facility].   Abdominal Pain      Past Medical History  Diagnosis Date  . Medical history non-contributory   . Diabetes mellitus without complication   . Gestational diabetes     Past Surgical History  Procedure Laterality Date  . Cesarean section      History reviewed. No pertinent family history.  History  Substance Use Topics  . Smoking status: Never Smoker   . Smokeless tobacco: Not on file  . Alcohol Use: No    Allergies: No Known Allergies  Prescriptions prior to admission  Medication Sig Dispense Refill  . Prenatal Vit-Fe Fumarate-FA (PRENATAL MULTIVITAMIN) TABS Take 1 tablet by mouth daily at 12 noon.        Review of Systems  Constitutional: Negative.   HENT: Negative.   Eyes: Negative.   Respiratory: Negative.   Cardiovascular: Negative.   Gastrointestinal: Positive for abdominal pain.  Genitourinary: Negative.   Musculoskeletal: Negative.   Skin: Negative.   Neurological: Negative.   Psychiatric/Behavioral: Negative.    Physical Exam   Blood pressure 107/72, pulse 92, temperature 98.3 F (36.8 C), temperature source Oral, resp. rate 18, height 4' 10.75" (1.492 m), weight 63.866 kg (140 lb 12.8 oz), last menstrual period 01/15/2013, SpO2 99.00%.  Physical Exam  Constitutional: She appears well-developed and well-nourished. No distress.  HENT:  Head: Normocephalic and atraumatic.  GI: Soft. Bowel sounds are normal. She exhibits no distension. There is no tenderness. There is no rebound and no guarding.  Genitourinary: Vagina  normal and uterus normal. No vaginal discharge found.  Cervix closed and long  Musculoskeletal: Normal range of motion.  Neurological: She is alert.  Skin: Skin is warm.  Psychiatric: She has a normal mood and affect.   Results for orders placed during the hospital encounter of 09/24/13 (from the past 24 hour(s))  URINALYSIS, ROUTINE W REFLEX MICROSCOPIC     Status: Abnormal   Collection Time    09/24/13  8:05 AM      Result Value Range   Color, Urine YELLOW  YELLOW   APPearance CLOUDY (*) CLEAR   Specific Gravity, Urine 1.015  1.005 - 1.030   pH 7.0  5.0 - 8.0   Glucose, UA NEGATIVE  NEGATIVE mg/dL   Hgb urine dipstick NEGATIVE  NEGATIVE   Bilirubin Urine NEGATIVE  NEGATIVE   Ketones, ur NEGATIVE  NEGATIVE mg/dL   Protein, ur NEGATIVE  NEGATIVE mg/dL   Urobilinogen, UA 0.2  0.0 - 1.0 mg/dL   Nitrite NEGATIVE  NEGATIVE   Leukocytes, UA LARGE (*) NEGATIVE  URINE MICROSCOPIC-ADD ON     Status: Abnormal   Collection Time    09/24/13  8:05 AM      Result Value Range   Squamous Epithelial / LPF MANY (*) RARE   WBC, UA 3-6  <3 WBC/hpf   Bacteria, UA MANY (*) RARE  WET PREP, GENITAL     Status: Abnormal   Collection Time    09/24/13  8:50 AM      Result  Value Range   Yeast Wet Prep HPF POC NONE SEEN  NONE SEEN   Trich, Wet Prep NONE SEEN  NONE SEEN   Clue Cells Wet Prep HPF POC NONE SEEN  NONE SEEN   WBC, Wet Prep HPF POC MANY (*) NONE SEEN  GLUCOSE, CAPILLARY     Status: None   Collection Time    09/24/13  9:47 AM      Result Value Range   Glucose-Capillary 97  70 - 99 mg/dL    MAU Course  Procedures  MDM  Wet prep and GC/ Chlamydia Call Dr Kirtland Bouchard. Ross who advised accucheck now, HgbA1C, and an appointment tomorrow with Dr Dareen Piano at 2:15  Assessment and Plan   A: Braxton -Hicks Contractions  P: Pt requests note for work HgbA1C now Follow up tomorrow with Dr Dareen Piano at 2:15  Carolynn Serve 09/24/2013, 10:16 AM

## 2013-09-24 NOTE — MAU Note (Signed)
Pt states she woke up at 0200 this am with abd pain all over her abd.  Pt describes them like contractions which are intermittent.  Denies vaginal bleeding or ROM.  Good fetal movement.

## 2013-09-25 LAB — GC/CHLAMYDIA PROBE AMP
CT Probe RNA: NEGATIVE
GC Probe RNA: NEGATIVE

## 2013-09-25 LAB — URINE CULTURE

## 2013-10-07 ENCOUNTER — Inpatient Hospital Stay (HOSPITAL_COMMUNITY)
Admission: AD | Admit: 2013-10-07 | Discharge: 2013-10-07 | Disposition: A | Discharge status: Home / Self Care | Payer: Managed Care, Other (non HMO) | Source: Ambulatory Visit | Attending: Obstetrics and Gynecology | Admitting: Obstetrics and Gynecology

## 2013-10-07 ENCOUNTER — Encounter (HOSPITAL_COMMUNITY): Payer: Self-pay | Admitting: *Deleted

## 2013-10-07 DIAGNOSIS — K5289 Other specified noninfective gastroenteritis and colitis: Secondary | ICD-10-CM | POA: Insufficient documentation

## 2013-10-07 DIAGNOSIS — O99891 Other specified diseases and conditions complicating pregnancy: Secondary | ICD-10-CM | POA: Insufficient documentation

## 2013-10-07 DIAGNOSIS — O212 Late vomiting of pregnancy: Secondary | ICD-10-CM | POA: Insufficient documentation

## 2013-10-07 DIAGNOSIS — A084 Viral intestinal infection, unspecified: Secondary | ICD-10-CM

## 2013-10-07 DIAGNOSIS — O479 False labor, unspecified: Secondary | ICD-10-CM

## 2013-10-07 DIAGNOSIS — A088 Other specified intestinal infections: Secondary | ICD-10-CM

## 2013-10-07 DIAGNOSIS — O99810 Abnormal glucose complicating pregnancy: Secondary | ICD-10-CM | POA: Insufficient documentation

## 2013-10-07 DIAGNOSIS — J029 Acute pharyngitis, unspecified: Secondary | ICD-10-CM | POA: Insufficient documentation

## 2013-10-07 HISTORY — DX: Gestational (pregnancy-induced) hypertension without significant proteinuria, unspecified trimester: O13.9

## 2013-10-07 LAB — URINE MICROSCOPIC-ADD ON

## 2013-10-07 LAB — URINALYSIS, ROUTINE W REFLEX MICROSCOPIC
Glucose, UA: NEGATIVE mg/dL
Nitrite: NEGATIVE
Protein, ur: 30 mg/dL — AB
Specific Gravity, Urine: 1.025 (ref 1.005–1.030)
Urobilinogen, UA: 4 mg/dL — ABNORMAL HIGH (ref 0.0–1.0)
pH: 7 (ref 5.0–8.0)

## 2013-10-07 MED ORDER — DEXTROSE 5 % IN LACTATED RINGERS IV BOLUS
1000.0000 mL | Freq: Once | INTRAVENOUS | Status: AC
  Administered 2013-10-07: 1000 mL via INTRAVENOUS

## 2013-10-07 MED ORDER — ACETAMINOPHEN 500 MG PO TABS
1000.0000 mg | ORAL_TABLET | Freq: Once | ORAL | Status: AC
  Administered 2013-10-07: 1000 mg via ORAL
  Filled 2013-10-07: qty 2

## 2013-10-07 MED ORDER — LACTATED RINGERS IV BOLUS (SEPSIS)
1000.0000 mL | Freq: Once | INTRAVENOUS | Status: AC
  Administered 2013-10-07: 1000 mL via INTRAVENOUS

## 2013-10-07 MED ORDER — ONDANSETRON 4 MG PO TBDP: 4.0000 mg | ORAL_TABLET | Freq: Three times a day (TID) | ORAL | Status: DC | PRN

## 2013-10-07 NOTE — MAU Note (Signed)
Been having pains in abd, ? Loss of fluid, peeing lots. No bleeding.  Yesterday had vomiting. Has sore throat.

## 2013-10-07 NOTE — MAU Note (Signed)
Pt turned self back to semifowlers

## 2013-10-07 NOTE — Progress Notes (Signed)
J Rasch NP on  Unit and aware of pt's abd pain, sore throat, vomiting yesterday, elevated HR for mom. Orders received

## 2013-10-07 NOTE — Progress Notes (Signed)
Asked patient to describe the leakage of fld and states has not been leaking any fld. Just came in due to contractions

## 2013-10-07 NOTE — MAU Provider Note (Signed)
History     CSN: [id number]  Arrival date and time: 10/07/13 1408   First Provider Initiated Contact with Patient 10/07/13 1602      Chief Complaint  Patient presents with  . Labor Eval  . Sore Throat   Sore Throat     Janice Case is a 32 y.o. G2P1001 at [redacted]w[redacted]d who presents today with nausea and vomiting. She states that she was vomiting "all day" yesterday. She is not vomiting today, but she has not been able to eat or drink much. She denies any fever at home or diarrhea. She states that she has been feeling contractions. She denies any VB or LOF, and confirms fetal movement. She states that she is a diet controlled GDM. She was last seen in the office yesterday.   Past Medical History  Diagnosis Date  . Medical history non-contributory   . Diabetes mellitus without complication   . Gestational diabetes   . Pregnancy induced hypertension     Past Surgical History  Procedure Laterality Date  . Cesarean section      Family History  Problem Relation Age of Onset  . Alcohol abuse Neg Hx     History  Substance Use Topics  . Smoking status: Never Smoker   . Smokeless tobacco: Not on file  . Alcohol Use: No    Allergies: No Known Allergies  Prescriptions prior to admission  Medication Sig Dispense Refill  . Prenatal Vit-Fe Fumarate-FA (PRENATAL MULTIVITAMIN) TABS Take 1 tablet by mouth daily at 12 noon.        ROS Physical Exam   Blood pressure 99/66, pulse 126, temperature 98.8 F (37.1 C), temperature source Oral, height 4' 10.5" (1.486 m), weight 63.05 kg (139 lb), last menstrual period 01/15/2013, SpO2 99.00%.  Physical Exam  Nursing note and vitals reviewed. Constitutional: She is oriented to person, place, and time. She appears well-developed and well-nourished. No distress.  Low grade fever here   Cardiovascular: Normal rate.   Respiratory: Effort normal.  GI: Soft. There is no tenderness.  Neurological: She is alert and oriented to person,  place, and time.  Skin: Skin is warm and dry.  Psychiatric: She has a normal mood and affect.    MAU Course  Procedures  Results for orders placed during the hospital encounter of 10/07/13 (from the past 24 hour(s))  URINALYSIS, ROUTINE W REFLEX MICROSCOPIC     Status: Abnormal   Collection Time    10/07/13  3:20 PM      Result Value Range   Color, Urine YELLOW  YELLOW   APPearance HAZY (*) CLEAR   Specific Gravity, Urine 1.025  1.005 - 1.030   pH 7.0  5.0 - 8.0   Glucose, UA NEGATIVE  NEGATIVE mg/dL   Hgb urine dipstick TRACE (*) NEGATIVE   Bilirubin Urine SMALL (*) NEGATIVE   Ketones, ur >80 (*) NEGATIVE mg/dL   Protein, ur 30 (*) NEGATIVE mg/dL   Urobilinogen, UA 4.0 (*) 0.0 - 1.0 mg/dL   Nitrite NEGATIVE  NEGATIVE   Leukocytes, UA NEGATIVE  NEGATIVE  URINE MICROSCOPIC-ADD ON     Status: Abnormal   Collection Time    10/07/13  3:20 PM      Result Value Range   Squamous Epithelial / LPF MANY (*) RARE   WBC, UA 3-6  <3 WBC/hpf   RBC / HPF 0-2  <3 RBC/hpf   Bacteria, UA MANY (*) RARE    1732: C/W Dr. Henderson Cloud, ok for dc home.  Can send home with rx for zofran.  Assessment and Plan   1. Viral gastroenteritis      Medication List         ondansetron 4 MG disintegrating tablet  Commonly known as:  ZOFRAN ODT  Take 1 tablet (4 mg total) by mouth every 8 (eight) hours as needed for nausea or vomiting.     prenatal multivitamin Tabs tablet  Take 1 tablet by mouth daily at 12 noon.       Follow-up Information   Follow up with HORVATH,MICHELLE A, MD. (as scheduled )    Specialty:  Obstetrics and Gynecology   Contact information:   [address] [place] [address] [phone]      Labor precautions Fetal kick counts  Return to MAU as needed    Tawnya Crook 10/07/2013, 4:08 PM

## 2013-10-07 NOTE — MAU Note (Signed)
Pt up to BR

## 2013-10-08 LAB — URINE CULTURE: Colony Count: NO GROWTH

## 2013-10-10 ENCOUNTER — Encounter (HOSPITAL_COMMUNITY): Payer: Self-pay | Admitting: General Practice

## 2013-10-10 ENCOUNTER — Inpatient Hospital Stay (HOSPITAL_COMMUNITY)
Admission: AD | Admit: 2013-10-10 | Discharge: 2013-10-10 | Disposition: A | Discharge status: Home / Self Care | Payer: Managed Care, Other (non HMO) | Source: Ambulatory Visit | Attending: Obstetrics and Gynecology | Admitting: Obstetrics and Gynecology

## 2013-10-10 DIAGNOSIS — O99891 Other specified diseases and conditions complicating pregnancy: Secondary | ICD-10-CM | POA: Insufficient documentation

## 2013-10-10 LAB — POCT FERN TEST: POCT Fern Test: NEGATIVE

## 2013-10-10 NOTE — MAU Note (Signed)
Leaking fluid since 1320, good FM, no pain, no bleeding, has sore throat

## 2013-10-10 NOTE — OB Triage Note (Signed)
Patient states her water broke at 1322 today and was clear fluid. She does not report any contractions at this time.

## 2013-10-10 NOTE — OB Triage Note (Addendum)
Negative fern. No leaking of fluid noted at this time. Moderate yeast noted to glove after exam. Pt does complain of itching.Dr. Henderson Cloud notified and instructed to discharge patient.

## 2013-10-13 ENCOUNTER — Encounter (HOSPITAL_COMMUNITY): Payer: Managed Care, Other (non HMO) | Admitting: Anesthesiology

## 2013-10-13 ENCOUNTER — Inpatient Hospital Stay (HOSPITAL_COMMUNITY): Payer: Managed Care, Other (non HMO) | Admitting: Anesthesiology

## 2013-10-13 ENCOUNTER — Encounter (HOSPITAL_COMMUNITY): Payer: Self-pay

## 2013-10-13 ENCOUNTER — Inpatient Hospital Stay (HOSPITAL_COMMUNITY)
Admission: AD | Admit: 2013-10-13 | Discharge: 2013-10-15 | DRG: 765 | Disposition: A | Discharge status: Home / Self Care | Payer: Managed Care, Other (non HMO) | Source: Ambulatory Visit | Attending: Obstetrics and Gynecology | Admitting: Obstetrics and Gynecology

## 2013-10-13 ENCOUNTER — Encounter (HOSPITAL_COMMUNITY)
Admission: AD | Disposition: A | Discharge status: Home / Self Care | Payer: Self-pay | Source: Ambulatory Visit | Attending: Obstetrics and Gynecology

## 2013-10-13 DIAGNOSIS — E119 Type 2 diabetes mellitus without complications: Secondary | ICD-10-CM | POA: Diagnosis present

## 2013-10-13 DIAGNOSIS — O34219 Maternal care for unspecified type scar from previous cesarean delivery: Principal | ICD-10-CM | POA: Diagnosis present

## 2013-10-13 DIAGNOSIS — Z9889 Other specified postprocedural states: Secondary | ICD-10-CM

## 2013-10-13 DIAGNOSIS — O2432 Unspecified pre-existing diabetes mellitus in childbirth: Secondary | ICD-10-CM | POA: Diagnosis present

## 2013-10-13 LAB — CBC
HCT: 40.8 % (ref 36.0–46.0)
Hemoglobin: 14.5 g/dL (ref 12.0–15.0)
MCHC: 35.5 g/dL (ref 30.0–36.0)
RBC: 4.86 MIL/uL (ref 3.87–5.11)
WBC: 11.5 10*3/uL — ABNORMAL HIGH (ref 4.0–10.5)

## 2013-10-13 LAB — RPR: RPR Ser Ql: NONREACTIVE

## 2013-10-13 SURGERY — Surgical Case
Anesthesia: Regional

## 2013-10-13 SURGERY — Surgical Case
Anesthesia: Spinal

## 2013-10-13 MED ORDER — MORPHINE SULFATE 0.5 MG/ML IJ SOLN
INTRAMUSCULAR | Status: AC
  Filled 2013-10-13: qty 10

## 2013-10-13 MED ORDER — FENTANYL CITRATE 0.05 MG/ML IJ SOLN
INTRAMUSCULAR | Status: DC | PRN
  Administered 2013-10-13: 15 ug via INTRATHECAL

## 2013-10-13 MED ORDER — ONDANSETRON HCL 4 MG/2ML IJ SOLN
INTRAMUSCULAR | Status: AC
  Filled 2013-10-13: qty 2

## 2013-10-13 MED ORDER — BUPIVACAINE IN DEXTROSE 0.75-8.25 % IT SOLN
INTRATHECAL | Status: DC | PRN
  Administered 2013-10-13: 1.4 mg via INTRATHECAL

## 2013-10-13 MED ORDER — OXYTOCIN 10 UNIT/ML IJ SOLN
40.0000 [IU] | INTRAVENOUS | Status: DC | PRN
  Administered 2013-10-13: 40 [IU] via INTRAVENOUS

## 2013-10-13 MED ORDER — NALBUPHINE HCL 10 MG/ML IJ SOLN
5.0000 mg | INTRAMUSCULAR | Status: DC | PRN
  Filled 2013-10-13: qty 1

## 2013-10-13 MED ORDER — ONDANSETRON HCL 4 MG/2ML IJ SOLN: 4.0000 mg | Freq: Three times a day (TID) | INTRAMUSCULAR | Status: DC | PRN

## 2013-10-13 MED ORDER — FERROUS SULFATE 325 (65 FE) MG PO TABS
325.0000 mg | ORAL_TABLET | Freq: Two times a day (BID) | ORAL | Status: DC
  Administered 2013-10-14 – 2013-10-15 (×3): 325 mg via ORAL
  Filled 2013-10-13 (×3): qty 1

## 2013-10-13 MED ORDER — IBUPROFEN 600 MG PO TABS
600.0000 mg | ORAL_TABLET | Freq: Four times a day (QID) | ORAL | Status: DC
  Administered 2013-10-13 – 2013-10-15 (×7): 600 mg via ORAL
  Filled 2013-10-13 (×7): qty 1

## 2013-10-13 MED ORDER — FAMOTIDINE IN NACL 20-0.9 MG/50ML-% IV SOLN
20.0000 mg | Freq: Once | INTRAVENOUS | Status: AC
  Administered 2013-10-13: 20 mg via INTRAVENOUS
  Filled 2013-10-13: qty 50

## 2013-10-13 MED ORDER — KETOROLAC TROMETHAMINE 60 MG/2ML IM SOLN
60.0000 mg | Freq: Once | INTRAMUSCULAR | Status: AC | PRN
  Administered 2013-10-13: 60 mg via INTRAMUSCULAR

## 2013-10-13 MED ORDER — DIPHENHYDRAMINE HCL 25 MG PO CAPS: 25.0000 mg | ORAL_CAPSULE | ORAL | Status: DC | PRN

## 2013-10-13 MED ORDER — METHYLERGONOVINE MALEATE 0.2 MG/ML IJ SOLN: 0.2000 mg | INTRAMUSCULAR | Status: DC | PRN

## 2013-10-13 MED ORDER — MORPHINE SULFATE (PF) 0.5 MG/ML IJ SOLN
INTRAMUSCULAR | Status: DC | PRN
  Administered 2013-10-13: .1 mg via INTRATHECAL

## 2013-10-13 MED ORDER — CEFAZOLIN SODIUM-DEXTROSE 2-3 GM-% IV SOLR
INTRAVENOUS | Status: AC
  Filled 2013-10-13: qty 50

## 2013-10-13 MED ORDER — PROMETHAZINE HCL 25 MG/ML IJ SOLN: 6.2500 mg | INTRAMUSCULAR | Status: DC | PRN

## 2013-10-13 MED ORDER — BISACODYL 10 MG RE SUPP: 10.0000 mg | Freq: Every day | RECTAL | Status: DC | PRN

## 2013-10-13 MED ORDER — NALOXONE HCL 0.4 MG/ML IJ SOLN: 0.4000 mg | INTRAMUSCULAR | Status: DC | PRN

## 2013-10-13 MED ORDER — HYDROMORPHONE HCL PF 1 MG/ML IJ SOLN: 0.2500 mg | INTRAMUSCULAR | Status: DC | PRN

## 2013-10-13 MED ORDER — METHYLERGONOVINE MALEATE 0.2 MG PO TABS: 0.2000 mg | ORAL_TABLET | ORAL | Status: DC | PRN

## 2013-10-13 MED ORDER — KETOROLAC TROMETHAMINE 30 MG/ML IJ SOLN: 30.0000 mg | Freq: Four times a day (QID) | INTRAMUSCULAR | Status: AC | PRN

## 2013-10-13 MED ORDER — PHENYLEPHRINE 8 MG IN D5W 100 ML (0.08MG/ML) PREMIX OPTIME
INJECTION | INTRAVENOUS | Status: AC
  Filled 2013-10-13: qty 100

## 2013-10-13 MED ORDER — ONDANSETRON HCL 4 MG/2ML IJ SOLN: 4.0000 mg | INTRAMUSCULAR | Status: DC | PRN

## 2013-10-13 MED ORDER — SCOPOLAMINE 1 MG/3DAYS TD PT72
MEDICATED_PATCH | TRANSDERMAL | Status: AC
  Filled 2013-10-13: qty 1

## 2013-10-13 MED ORDER — SODIUM CHLORIDE 0.9 % IJ SOLN: 3.0000 mL | INTRAMUSCULAR | Status: DC | PRN

## 2013-10-13 MED ORDER — DIPHENHYDRAMINE HCL 50 MG/ML IJ SOLN: 12.5000 mg | INTRAMUSCULAR | Status: DC | PRN

## 2013-10-13 MED ORDER — WITCH HAZEL-GLYCERIN EX PADS: 1.0000 "application " | MEDICATED_PAD | CUTANEOUS | Status: DC | PRN

## 2013-10-13 MED ORDER — NALBUPHINE HCL 10 MG/ML IJ SOLN
5.0000 mg | INTRAMUSCULAR | Status: DC | PRN
  Administered 2013-10-13: 10 mg via SUBCUTANEOUS
  Filled 2013-10-13: qty 1

## 2013-10-13 MED ORDER — NALOXONE HCL 1 MG/ML IJ SOLN: 1.0000 ug/kg/h | INTRAVENOUS | Status: DC | PRN

## 2013-10-13 MED ORDER — SENNOSIDES-DOCUSATE SODIUM 8.6-50 MG PO TABS
2.0000 | ORAL_TABLET | ORAL | Status: DC
  Administered 2013-10-13 – 2013-10-14 (×2): 2 via ORAL
  Filled 2013-10-13 (×2): qty 2

## 2013-10-13 MED ORDER — NALBUPHINE SYRINGE 5 MG/0.5 ML
INJECTION | INTRAMUSCULAR | Status: AC
  Administered 2013-10-13: 10 mg via SUBCUTANEOUS
  Filled 2013-10-13: qty 1

## 2013-10-13 MED ORDER — ONDANSETRON HCL 4 MG PO TABS: 4.0000 mg | ORAL_TABLET | ORAL | Status: DC | PRN

## 2013-10-13 MED ORDER — MEPERIDINE HCL 25 MG/ML IJ SOLN: 6.2500 mg | INTRAMUSCULAR | Status: DC | PRN

## 2013-10-13 MED ORDER — METOCLOPRAMIDE HCL 5 MG/ML IJ SOLN: 10.0000 mg | Freq: Three times a day (TID) | INTRAMUSCULAR | Status: DC | PRN

## 2013-10-13 MED ORDER — TETANUS-DIPHTH-ACELL PERTUSSIS 5-2.5-18.5 LF-MCG/0.5 IM SUSP
0.5000 mL | Freq: Once | INTRAMUSCULAR | Status: AC
  Administered 2013-10-15: 0.5 mL via INTRAMUSCULAR

## 2013-10-13 MED ORDER — FLEET ENEMA 7-19 GM/118ML RE ENEM: 1.0000 | ENEMA | Freq: Every day | RECTAL | Status: DC | PRN

## 2013-10-13 MED ORDER — LACTATED RINGERS IV SOLN: INTRAVENOUS | Status: DC

## 2013-10-13 MED ORDER — ZOLPIDEM TARTRATE 5 MG PO TABS: 5.0000 mg | ORAL_TABLET | Freq: Every evening | ORAL | Status: DC | PRN

## 2013-10-13 MED ORDER — MEASLES, MUMPS & RUBELLA VAC ~~LOC~~ INJ: 0.5000 mL | INJECTION | Freq: Once | SUBCUTANEOUS | Status: DC

## 2013-10-13 MED ORDER — FENTANYL CITRATE 0.05 MG/ML IJ SOLN
INTRAMUSCULAR | Status: DC | PRN
  Administered 2013-10-13: 85 ug via INTRAVENOUS

## 2013-10-13 MED ORDER — KETOROLAC TROMETHAMINE 30 MG/ML IJ SOLN
30.0000 mg | Freq: Four times a day (QID) | INTRAMUSCULAR | Status: AC | PRN
  Administered 2013-10-13: 30 mg via INTRAVENOUS
  Filled 2013-10-13: qty 1

## 2013-10-13 MED ORDER — KETOROLAC TROMETHAMINE 60 MG/2ML IM SOLN
INTRAMUSCULAR | Status: AC
  Administered 2013-10-13: 60 mg via INTRAMUSCULAR
  Filled 2013-10-13: qty 2

## 2013-10-13 MED ORDER — DIPHENHYDRAMINE HCL 50 MG/ML IJ SOLN: 25.0000 mg | INTRAMUSCULAR | Status: DC | PRN

## 2013-10-13 MED ORDER — OXYTOCIN 10 UNIT/ML IJ SOLN
INTRAMUSCULAR | Status: AC
  Filled 2013-10-13: qty 4

## 2013-10-13 MED ORDER — OXYCODONE-ACETAMINOPHEN 5-325 MG PO TABS
1.0000 | ORAL_TABLET | ORAL | Status: DC | PRN
  Administered 2013-10-14: 1 via ORAL
  Filled 2013-10-13: qty 1

## 2013-10-13 MED ORDER — PHENYLEPHRINE 8 MG IN D5W 100 ML (0.08MG/ML) PREMIX OPTIME
INJECTION | INTRAVENOUS | Status: DC | PRN
  Administered 2013-10-13: 60 ug/min via INTRAVENOUS

## 2013-10-13 MED ORDER — DIPHENHYDRAMINE HCL 25 MG PO CAPS: 25.0000 mg | ORAL_CAPSULE | Freq: Four times a day (QID) | ORAL | Status: DC | PRN

## 2013-10-13 MED ORDER — LACTATED RINGERS IV SOLN
INTRAVENOUS | Status: DC | PRN
  Administered 2013-10-13 (×3): via INTRAVENOUS

## 2013-10-13 MED ORDER — ONDANSETRON HCL 4 MG/2ML IJ SOLN
INTRAMUSCULAR | Status: DC | PRN
  Administered 2013-10-13: 4 mg via INTRAVENOUS

## 2013-10-13 MED ORDER — SIMETHICONE 80 MG PO CHEW
80.0000 mg | CHEWABLE_TABLET | ORAL | Status: DC
  Administered 2013-10-13: 80 mg via ORAL
  Filled 2013-10-13: qty 1

## 2013-10-13 MED ORDER — CITRIC ACID-SODIUM CITRATE 334-500 MG/5ML PO SOLN
30.0000 mL | Freq: Once | ORAL | Status: AC
  Administered 2013-10-13: 30 mL via ORAL
  Filled 2013-10-13: qty 15

## 2013-10-13 MED ORDER — DIBUCAINE 1 % RE OINT: 1.0000 "application " | TOPICAL_OINTMENT | RECTAL | Status: DC | PRN

## 2013-10-13 MED ORDER — SCOPOLAMINE 1 MG/3DAYS TD PT72
1.0000 | MEDICATED_PATCH | Freq: Once | TRANSDERMAL | Status: DC
  Administered 2013-10-13: 1.5 mg via TRANSDERMAL

## 2013-10-13 MED ORDER — SIMETHICONE 80 MG PO CHEW: 80.0000 mg | CHEWABLE_TABLET | ORAL | Status: DC | PRN

## 2013-10-13 MED ORDER — LANOLIN HYDROUS EX OINT: 1.0000 "application " | TOPICAL_OINTMENT | CUTANEOUS | Status: DC | PRN

## 2013-10-13 MED ORDER — FENTANYL CITRATE 0.05 MG/ML IJ SOLN
INTRAMUSCULAR | Status: AC
  Filled 2013-10-13: qty 2

## 2013-10-13 MED ORDER — CEFAZOLIN SODIUM-DEXTROSE 2-3 GM-% IV SOLR
INTRAVENOUS | Status: DC | PRN
  Administered 2013-10-13: 2 g via INTRAVENOUS

## 2013-10-13 MED ORDER — PRENATAL MULTIVITAMIN CH
1.0000 | ORAL_TABLET | Freq: Every day | ORAL | Status: DC
  Administered 2013-10-13 – 2013-10-15 (×3): 1 via ORAL
  Filled 2013-10-13 (×3): qty 1

## 2013-10-13 MED ORDER — MENTHOL 3 MG MT LOZG
1.0000 | LOZENGE | OROMUCOSAL | Status: DC | PRN
  Administered 2013-10-13: 3 mg via ORAL
  Filled 2013-10-13: qty 9

## 2013-10-13 MED ORDER — SIMETHICONE 80 MG PO CHEW
80.0000 mg | CHEWABLE_TABLET | Freq: Three times a day (TID) | ORAL | Status: DC
  Administered 2013-10-15: 80 mg via ORAL
  Filled 2013-10-13: qty 1

## 2013-10-13 MED ORDER — OXYTOCIN 40 UNITS IN LACTATED RINGERS INFUSION - SIMPLE MED: 62.5000 mL/h | INTRAVENOUS | Status: AC

## 2013-10-13 SURGICAL SUPPLY — 33 items
BENZOIN TINCTURE PRP APPL 2/3 (GAUZE/BANDAGES/DRESSINGS) ×2 IMPLANT
CLAMP CORD UMBIL (MISCELLANEOUS) IMPLANT
CLOTH BEACON ORANGE TIMEOUT ST (SAFETY) ×2 IMPLANT
DRAPE LG THREE QUARTER DISP (DRAPES) IMPLANT
DRSG OPSITE POSTOP 4X10 (GAUZE/BANDAGES/DRESSINGS) ×2 IMPLANT
DURAPREP 26ML APPLICATOR (WOUND CARE) ×2 IMPLANT
ELECT REM PT RETURN 9FT ADLT (ELECTROSURGICAL) ×2
ELECTRODE REM PT RTRN 9FT ADLT (ELECTROSURGICAL) ×1 IMPLANT
EXTRACTOR VACUUM BELL STYLE (SUCTIONS) IMPLANT
GLOVE BIO SURGEON STRL SZ7 (GLOVE) ×2 IMPLANT
GOWN PREVENTION PLUS XLARGE (GOWN DISPOSABLE) ×2 IMPLANT
GOWN STRL REIN XL XLG (GOWN DISPOSABLE) ×2 IMPLANT
KIT ABG SYR 3ML LUER SLIP (SYRINGE) IMPLANT
NEEDLE HYPO 25X5/8 SAFETYGLIDE (NEEDLE) IMPLANT
NS IRRIG 1000ML POUR BTL (IV SOLUTION) ×2 IMPLANT
PACK C SECTION WH (CUSTOM PROCEDURE TRAY) ×2 IMPLANT
PAD OB MATERNITY 4.3X12.25 (PERSONAL CARE ITEMS) ×2 IMPLANT
RETRACTOR WND ALEXIS 25 LRG (MISCELLANEOUS) ×1 IMPLANT
RTRCTR WOUND ALEXIS 25CM LRG (MISCELLANEOUS) ×2
STAPLER VISISTAT 35W (STAPLE) IMPLANT
STRIP CLOSURE SKIN 1/2X4 (GAUZE/BANDAGES/DRESSINGS) ×2 IMPLANT
SUT MNCRL 0 VIOLET CTX 36 (SUTURE) ×2 IMPLANT
SUT MONOCRYL 0 CTX 36 (SUTURE) ×2
SUT PDS AB 0 CTX 60 (SUTURE) ×6 IMPLANT
SUT PLAIN 2 0 XLH (SUTURE) IMPLANT
SUT VIC AB 0 CT1 27 (SUTURE) ×2
SUT VIC AB 0 CT1 27XBRD ANBCTR (SUTURE) ×2 IMPLANT
SUT VIC AB 2-0 CT1 27 (SUTURE) ×1
SUT VIC AB 2-0 CT1 TAPERPNT 27 (SUTURE) ×1 IMPLANT
SUT VIC AB 4-0 KS 27 (SUTURE) ×2 IMPLANT
TOWEL OR 17X24 6PK STRL BLUE (TOWEL DISPOSABLE) ×2 IMPLANT
TRAY FOLEY CATH 14FR (SET/KITS/TRAYS/PACK) ×2 IMPLANT
WATER STERILE IRR 1000ML POUR (IV SOLUTION) ×2 IMPLANT

## 2013-10-13 NOTE — Transfer of Care (Signed)
Immediate Anesthesia Transfer of Care Note  Patient: Janice Case  Procedure(s) Performed: Procedure(s): CESAREAN SECTION (N/A)  Patient Location: PACU  Anesthesia Type:Spinal  Level of Consciousness: awake  Airway & Oxygen Therapy: Patient Spontanous Breathing  Post-op Assessment: Report given to PACU RN and Post -op Vital signs reviewed and stable  Post vital signs: stable  Complications: No apparent anesthesia complications

## 2013-10-13 NOTE — H&P (Signed)
32 y.o.  [redacted]w[redacted]d    G2P1001 comes in active labor and desires a repeat cesarean section at term.  Patient has good fetal movement and no bleeding.   Past Medical History  Diagnosis Date  . Diabetes mellitus without complication   . Gestational diabetes   . Pregnancy induced hypertension     Past Surgical History  Procedure Laterality Date  . Cesarean section      OB History  Gravida Para Term Preterm AB SAB TAB Ectopic Multiple Living  2 1 1       1     # Outcome Date GA Lbr Len/2nd Weight Sex Delivery Anes PTL Lv  2 CUR           1 TRM 01/08/09 [redacted]w[redacted]d  3.6 kg (7 lb 15 oz) F LTCS   Y      History   Social History  . Marital Status: Married    Spouse Name: N/A    Number of Children: N/A  . Years of Education: N/A   Occupational History  . Not on file.   Social History Main Topics  . Smoking status: Never Smoker   . Smokeless tobacco: Not on file  . Alcohol Use: No  . Drug Use: No  . Sexual Activity: Yes     Comment: pregnant   Other Topics Concern  . Not on file   Social History Narrative  . No narrative on file   Review of patient's allergies indicates no known allergies.   Prenatal Course: See below.   Prenatal Transfer Tool  Maternal Diabetes: Yes:  Diabetes Type:  Diet controlled- but pt did not bring glucoses to office- uncertain control; did have reassuring NSTs in office.  Was at one point supposed to start on Glyburide but did not. Genetic Screening: Normal Maternal Ultrasounds/Referrals: Normal Fetal Ultrasounds or other Referrals:  None Maternal Substance Abuse:  No Significant Maternal Medications:  None Significant Maternal Lab Results: None   Filed Vitals:   10/13/13 0430  BP: 113/89  Pulse: 85  Temp: 97.9 F (36.6 C)  Resp: 18     Lungs/Cor:  NAD Abdomen:  soft, gravid Ex:  no cords, erythema SVE:  NA FHTs:  NST R  A/P   For repeat cesarean sectionat term despite labor.  All risks, benefits and alternatives discussed with patient  with [facility] and she desires to proceed.  Pt had not mentioned BTL in prenatal record and is uncertain if she desires- will not do.     Avondre Richens A

## 2013-10-13 NOTE — Anesthesia Preprocedure Evaluation (Signed)
Anesthesia Evaluation  Patient identified by MRN, date of birth, ID band Patient awake    Reviewed: Allergy & Precautions, H&P , NPO status , Patient's Chart, lab work & pertinent test results  Airway Mallampati: II TM Distance: >3 FB Neck ROM: Full    Dental  (+) Dental Advisory Given   Pulmonary neg pulmonary ROS,  breath sounds clear to auscultation        Cardiovascular hypertension, negative cardio ROS  Rhythm:Regular     Neuro/Psych negative neurological ROS  negative psych ROS   GI/Hepatic negative GI ROS, Neg liver ROS,   Endo/Other  negative endocrine ROSdiabetes, Gestational  Renal/GU negative Renal ROS     Musculoskeletal negative musculoskeletal ROS (+)   Abdominal   Peds  Hematology negative hematology ROS (+)   Anesthesia Other Findings   Reproductive/Obstetrics (+) Pregnancy                           Anesthesia Physical Anesthesia Plan  ASA: II  Anesthesia Plan: Spinal   Post-op Pain Management:    Induction:   Airway Management Planned:   Additional Equipment:   Intra-op Plan:   Post-operative Plan:   Informed Consent: I have reviewed the patients History and Physical, chart, labs and discussed the procedure including the risks, benefits and alternatives for the proposed anesthesia with the patient or authorized representative who has indicated his/her understanding and acceptance.   Dental advisory given  Plan Discussed with: CRNA  Anesthesia Plan Comments:         Anesthesia Quick Evaluation

## 2013-10-13 NOTE — Anesthesia Postprocedure Evaluation (Signed)
  Anesthesia Post-op Note  Patient: Janice Case  Procedure(s) Performed: Procedure(s): CESAREAN SECTION (N/A)  Patient is awake, responsive, moving her legs, and has signs of resolution of her numbness. Pain and nausea are reasonably well controlled. Vital signs are stable and clinically acceptable. Oxygen saturation is clinically acceptable. There are no apparent anesthetic complications at this time. Patient is ready for discharge.

## 2013-10-13 NOTE — Op Note (Signed)
10/13/2013  6:27 AM  PATIENT:  Janice Case  32 y.o. female  PRE-OPERATIVE DIAGNOSIS:   repeat C-section   POST-OPERATIVE DIAGNOSIS:  Repeat C-section in labor  PROCEDURE:  Procedure(s): CESAREAN SECTION (N/A)  SURGEON:  Surgeon(s) and Role:    * Lyne Khurana A Maverick Patman, MD - Primary  PHYSICIAN ASSISTANT:   ASSISTANTS: none   ANESTHESIA:   spinal  EBL:  Total I/O In: 2400 [I.V.:2400] Out: 850 [Urine:150; Blood:700]   SPECIMEN:  Source of Specimen:  placenta  DISPOSITION OF SPECIMEN:  PATHOLOGY  COUNTS:  YES  TOURNIQUET:  * No tourniquets in log *  DICTATION: .Note written in EPIC  PLAN OF CARE: Admit to inpatient   PATIENT DISPOSITION:  PACU - hemodynamically stable.   Delay start of Pharmacological VTE agent (>24hrs) due to surgical blood loss or risk of bleeding: not applicable  Complications:  none Medications:  Ancef, Pitocin Findings:  Baby female, Apgars 8,9, weight P.   Normal tubes, ovaries and uterus seen.  Baby was skin to skin with mother after birth in the OR.  Technique:  After adequate spinal anesthesia was achieved, the patient was prepped and draped in usual sterile fashion.  A foley catheter was used to drain the bladder.  A pfannanstiel incision was made with the scalpel and carried down to the fascia with the bovie cautery. The fascia was incised in the midline with the scalpel and carried in a transverse curvilinear manner bilaterally.  The fascia was reflected superiorly and inferiorly off the rectus muscles and the muscles split in the midline.  A bowel free portion of the peritoneum was entered bluntly and then extended in a superior and inferior manner with good visualization of the bowel and bladder.  The Alexis instrument was then placed and the vesico-uterine fascia tented up and incised in a transverse curvilinear manner.  A 2 cm transverse incision was made in the upper portion of the lower uterine segment until the amnion was exposed.   The  incision was extended transversely in a blunt manner.  Clear fluid was noted and the baby delivered in the vertex presentation without complication.  The baby was bulb suctioned and the cord was clamped and cut.  The baby was then handed to awaiting Neonatology.  The placenta was then delivered manually and the uterus cleared of all debris.  The uterine incision was then closed with a running lock stitch of 0 monocryl.  An imbricating layer of 0 monocryl was closed as well. Good hemostasis of the uterine incision was achieved and the abdomen was cleared with irrigation.  The peritoneum was closed with a running stitch of 2-0 vicryl.  This incorporated the rectus muscles as a separate layer.  The fascia was then closed with a running stitch of 0 vicryl.  The subcutaneous layer was closed with interrupted  stitches of 2-0 plain gut.  The skin was closed with 4-0 vicryl on keith and steristrips.  The patient tolerated the procedure well and was returned to the recovery room in stable condition.  All counts were correct times three.  Hal Norrington A   

## 2013-10-13 NOTE — Anesthesia Postprocedure Evaluation (Signed)
  Anesthesia Post-op Note  Patient: Janice Case  Procedure(s) Performed: Procedure(s): CESAREAN SECTION (N/A)  Patient Location: Mother/Baby  Anesthesia Type:Spinal  Level of Consciousness: awake, alert  and oriented  Airway and Oxygen Therapy: Patient Spontanous Breathing  Post-op Pain: none  Post-op Assessment: Post-op Vital signs reviewed  Post-op Vital Signs: Reviewed and stable  Complications: No apparent anesthesia complications

## 2013-10-13 NOTE — MAU Note (Signed)
Pt states uc began at 1245am. Denies vag bleeding or LOF. States +FM

## 2013-10-13 NOTE — Anesthesia Procedure Notes (Signed)
Spinal  Patient location during procedure: OR Start time: 10/13/2013 5:35 AM End time: 10/13/2013 5:39 AM Staffing Anesthesiologist: Lewie Loron R Performed by: anesthesiologist  Preanesthetic Checklist Completed: patient identified, site marked, surgical consent, pre-op evaluation, timeout performed, IV checked, risks and benefits discussed and monitors and equipment checked Spinal Block Patient position: sitting Prep: site prepped and draped and DuraPrep Patient monitoring: heart rate, continuous pulse ox and blood pressure Location: L2-3 Injection technique: single-shot Needle Needle type: Sprotte  Needle gauge: 24 G Needle length: 9 cm Assessment Sensory level: T4 Additional Notes Expiration date of kit checked and confirmed. Patient tolerated procedure well, without complications.

## 2013-10-13 NOTE — Brief Op Note (Signed)
10/13/2013  6:27 AM  PATIENT:  Janice Case  32 y.o. female  PRE-OPERATIVE DIAGNOSIS:   repeat C-section   POST-OPERATIVE DIAGNOSIS:  Repeat C-section in labor  PROCEDURE:  Procedure(s): CESAREAN SECTION (N/A)  SURGEON:  Surgeon(s) and Role:    * Loney Laurence, MD - Primary  PHYSICIAN ASSISTANT:   ASSISTANTS: none   ANESTHESIA:   spinal  EBL:  Total I/O In: 2400 [I.V.:2400] Out: 850 [Urine:150; Blood:700]   SPECIMEN:  Source of Specimen:  placenta  DISPOSITION OF SPECIMEN:  PATHOLOGY  COUNTS:  YES  TOURNIQUET:  * No tourniquets in log *  DICTATION: .Note written in EPIC  PLAN OF CARE: Admit to inpatient   PATIENT DISPOSITION:  PACU - hemodynamically stable.   Delay start of Pharmacological VTE agent (>24hrs) due to surgical blood loss or risk of bleeding: not applicable  Complications:  none Medications:  Ancef, Pitocin Findings:  Baby female, Apgars 8,9, weight P.   Normal tubes, ovaries and uterus seen.  Baby was skin to skin with mother after birth in the OR.  Technique:  After adequate spinal anesthesia was achieved, the patient was prepped and draped in usual sterile fashion.  A foley catheter was used to drain the bladder.  A pfannanstiel incision was made with the scalpel and carried down to the fascia with the bovie cautery. The fascia was incised in the midline with the scalpel and carried in a transverse curvilinear manner bilaterally.  The fascia was reflected superiorly and inferiorly off the rectus muscles and the muscles split in the midline.  A bowel free portion of the peritoneum was entered bluntly and then extended in a superior and inferior manner with good visualization of the bowel and bladder.  The Alexis instrument was then placed and the vesico-uterine fascia tented up and incised in a transverse curvilinear manner.  A 2 cm transverse incision was made in the upper portion of the lower uterine segment until the amnion was exposed.   The  incision was extended transversely in a blunt manner.  Clear fluid was noted and the baby delivered in the vertex presentation without complication.  The baby was bulb suctioned and the cord was clamped and cut.  The baby was then handed to awaiting Neonatology.  The placenta was then delivered manually and the uterus cleared of all debris.  The uterine incision was then closed with a running lock stitch of 0 monocryl.  An imbricating layer of 0 monocryl was closed as well. Good hemostasis of the uterine incision was achieved and the abdomen was cleared with irrigation.  The peritoneum was closed with a running stitch of 2-0 vicryl.  This incorporated the rectus muscles as a separate layer.  The fascia was then closed with a running stitch of 0 vicryl.  The subcutaneous layer was closed with interrupted  stitches of 2-0 plain gut.  The skin was closed with 4-0 vicryl on keith and steristrips.  The patient tolerated the procedure well and was returned to the recovery room in stable condition.  All counts were correct times three.  Meldrick Buttery A

## 2013-10-14 ENCOUNTER — Encounter (HOSPITAL_COMMUNITY): Payer: Self-pay | Admitting: Obstetrics and Gynecology

## 2013-10-14 LAB — CBC
HCT: 28.5 % — ABNORMAL LOW (ref 36.0–46.0)
MCH: 29.8 pg (ref 26.0–34.0)
MCHC: 35.1 g/dL (ref 30.0–36.0)
MCV: 84.8 fL (ref 78.0–100.0)
Platelets: 182 10*3/uL (ref 150–400)
RDW: 12.9 % (ref 11.5–15.5)
WBC: 16 10*3/uL — ABNORMAL HIGH (ref 4.0–10.5)

## 2013-10-14 NOTE — Progress Notes (Signed)
CSW scheduled a Vitenamese interpreter to come tomorrow morning at 10am.  CSW will complete full assessment at that time.

## 2013-10-15 MED ORDER — IBUPROFEN 600 MG PO TABS: 600.0000 mg | ORAL_TABLET | Freq: Four times a day (QID) | ORAL | Status: AC | PRN

## 2013-10-15 MED ORDER — OXYCODONE-ACETAMINOPHEN 5-325 MG PO TABS: 2.0000 | ORAL_TABLET | ORAL | Status: AC | PRN

## 2013-10-15 MED ORDER — DOCUSATE SODIUM 100 MG PO CAPS: 100.0000 mg | ORAL_CAPSULE | Freq: Two times a day (BID) | ORAL | Status: DC

## 2013-10-15 NOTE — Progress Notes (Signed)
Patient ID: Janice Case, female   DOB: [birth year], 32 y.o.   MRN: [id number]  Patient now wants to go home. Per floor RN baby has a discharge. Will discharge home

## 2013-10-15 NOTE — Progress Notes (Signed)
Ur chart review completed.  

## 2013-10-15 NOTE — Progress Notes (Signed)
Subjective: Postpartum Day 2: Cesarean Delivery Patient reports some incisional pain, no nausea or vomiting  Objective: Vital signs in last 24 hours: Temp:  [97.2 F (36.2 C)-98 F (36.7 C)] 97.9 F (36.6 C) (12/24 0537) Pulse Rate:  [66-77] 66 (12/24 0537) Resp:  [16-18] 18 (12/24 0537) BP: (101-118)/(67-76) 110/71 mmHg (12/24 0537) SpO2:  [96 %-98 %] 97 % (12/24 0537)  Physical Exam:  General: alert, cooperative and appears stated age Lochia: appropriate Uterine Fundus: firm Incision: healing well DVT Evaluation: No evidence of DVT seen on physical exam.   Recent Labs  10/13/13 0450 10/14/13 0625  HGB 14.5 10.0*  HCT 40.8 28.5*    Assessment/Plan: Status post Cesarean section. Doing well postoperatively.  Continue current care. Infant now d/c'd from NICU and at bedside. Pt states peds talking about d/c home on Friday  Lyden Redner H. 10/15/2013, 10:18 AM

## 2013-10-15 NOTE — Progress Notes (Signed)
Pt is discharged in the care of husband. Downstairs per ambulatory. Denies pain or discomfort.Stable Honeycomb dressing is clean  And dry. Instructios were given on home care, Questions were asked and answered. Intertuptor present for all instructions.

## 2013-10-24 ENCOUNTER — Inpatient Hospital Stay (HOSPITAL_COMMUNITY)
Admission: AD | Admit: 2013-10-24 | Discharge: 2013-10-24 | Disposition: A | Discharge status: Home / Self Care | Payer: Managed Care, Other (non HMO) | Source: Ambulatory Visit | Attending: Obstetrics and Gynecology | Admitting: Obstetrics and Gynecology

## 2013-10-24 ENCOUNTER — Encounter (HOSPITAL_COMMUNITY): Payer: Self-pay | Admitting: *Deleted

## 2013-10-24 DIAGNOSIS — O8600 Infection of obstetric surgical wound, unspecified: Secondary | ICD-10-CM

## 2013-10-24 DIAGNOSIS — O909 Complication of the puerperium, unspecified: Secondary | ICD-10-CM | POA: Insufficient documentation

## 2013-10-24 LAB — CBC
HCT: 36.2 % (ref 36.0–46.0)
HEMOGLOBIN: 12.3 g/dL (ref 12.0–15.0)
MCH: 28.9 pg (ref 26.0–34.0)
MCHC: 34 g/dL (ref 30.0–36.0)
MCV: 85.2 fL (ref 78.0–100.0)
Platelets: 377 10*3/uL (ref 150–400)
RBC: 4.25 MIL/uL (ref 3.87–5.11)
RDW: 12.6 % (ref 11.5–15.5)
WBC: 7.3 10*3/uL (ref 4.0–10.5)

## 2013-10-24 MED ORDER — CEPHALEXIN 500 MG PO CAPS: 500.0000 mg | ORAL_CAPSULE | Freq: Four times a day (QID) | ORAL | Status: AC

## 2013-10-24 NOTE — MAU Note (Signed)
Left side of incision- ~2inches- reddened area, 2 small area ~1/4 inch open, some clear drainage noted.

## 2013-10-24 NOTE — MAU Provider Note (Signed)
History     CSN: [id number]  Arrival date and time: 10/24/13 1717   First Provider Initiated Contact with Patient 10/24/13 1855      Chief Complaint  Patient presents with  . Post-op Problem   HPI 33 y.o. G2P2002 at 11 days s/p repeat C/S with open area in c/s incision, redness and drainage. First noticed yesterday, otherwise feeling well.   Past Medical History  Diagnosis Date  . Diabetes mellitus without complication   . Gestational diabetes   . Pregnancy induced hypertension     Past Surgical History  Procedure Laterality Date  . Cesarean section    . Cesarean section N/A 10/13/2013    Procedure: CESAREAN SECTION;  Surgeon: Loney LaurenceMichelle A Horvath, MD;  Location: [facility];  Service: Obstetrics;  Laterality: N/A;    Family History  Problem Relation Age of Onset  . Alcohol abuse Neg Hx     History  Substance Use Topics  . Smoking status: Never Smoker   . Smokeless tobacco: Not on file  . Alcohol Use: No    Allergies: No Known Allergies  Prescriptions prior to admission  Medication Sig Dispense Refill  . docusate sodium (COLACE) 100 MG capsule Take 1 capsule (100 mg total) by mouth 2 (two) times daily.  60 capsule  0  . ibuprofen (ADVIL,MOTRIN) 600 MG tablet Take 1 tablet (600 mg total) by mouth every 6 (six) hours as needed.  90 tablet  0  . oxyCODONE-acetaminophen (ROXICET) 5-325 MG per tablet Take 2 tablets by mouth every 4 (four) hours as needed for severe pain. May take 1-2 tablets every 4-6 hours as needed for pain  46 tablet  0  . Prenatal Vit-Fe Fumarate-FA (PRENATAL MULTIVITAMIN) TABS Take 1 tablet by mouth daily at 12 noon.        Review of Systems  Constitutional: Negative.  Negative for fever and chills.  Respiratory: Negative.   Cardiovascular: Negative.   Gastrointestinal: Negative for nausea, vomiting, abdominal pain, diarrhea and constipation.  Genitourinary: Negative for dysuria, urgency, frequency, hematuria and flank pain.  Musculoskeletal:  Negative.   Neurological: Negative.   Psychiatric/Behavioral: Negative.    Physical Exam   Blood pressure 121/84, pulse 77, temperature 98.8 F (37.1 C), temperature source Oral, resp. rate 18, last menstrual period 01/15/2013, not currently breastfeeding.  Physical Exam  Nursing note and vitals reviewed. Constitutional: She is oriented to person, place, and time. She appears well-developed and well-nourished. No distress.  Cardiovascular: Normal rate.   Respiratory: Effort normal.  GI: Soft. She exhibits no distension and no mass. There is no tenderness. There is no rebound and no guarding.  Incision well healed except 2 in at left end of incision with approx 2 cm surrounding erythema, superficially open with small amount of clear drainage  Musculoskeletal: Normal range of motion.  Neurological: She is alert and oriented to person, place, and time.  Skin: Skin is warm and dry.  Psychiatric: She has a normal mood and affect.    MAU Course  Procedures  Results for orders placed during the hospital encounter of 10/24/13 (from the past 24 hour(s))  CBC     Status: None   Collection Time    10/24/13  6:35 PM      Result Value Range   WBC 7.3  4.0 - 10.5 K/uL   RBC 4.25  3.87 - 5.11 MIL/uL   Hemoglobin 12.3  12.0 - 15.0 g/dL   HCT 40.936.2  81.136.0 - 91.446.0 %   MCV 85.2  78.0 - 100.0 fL   MCH 28.9  26.0 - 34.0 pg   MCHC 34.0  30.0 - 36.0 g/dL   RDW 16.1  09.6 - 04.5 %   Platelets 377  150 - 400 K/uL     Assessment and Plan   1. Wound infection following cesarean section, postpartum       Medication List         cephALEXin 500 MG capsule  Commonly known as:  KEFLEX  Take 1 capsule (500 mg total) by mouth 4 (four) times daily.     ibuprofen 600 MG tablet  Commonly known as:  ADVIL,MOTRIN  Take 1 tablet (600 mg total) by mouth every 6 (six) hours as needed.     oxyCODONE-acetaminophen 5-325 MG per tablet  Commonly known as:  ROXICET  Take 2 tablets by mouth every 4 (four)  hours as needed for severe pain. May take 1-2 tablets every 4-6 hours as needed for pain     prenatal multivitamin Tabs tablet  Take 1 tablet by mouth daily at 12 noon.            Follow-up Information   Follow up with HORVATH,MICHELLE A, MD. Schedule an appointment as soon as possible for a visit on 10/27/2013.   Specialty:  Obstetrics and Gynecology   Contact information:   [address] [place] [address] [phone]         [facility] 10/24/2013, 6:57 PM

## 2013-10-24 NOTE — Discharge Instructions (Signed)
Surgical Site Infection  A surgical site infection can occur after surgery. It happens in the part of the body where the surgery took place. Most patients who have surgery do not develop an infection.   SYMPTOMS  · Redness and pain around the surgical site.  · Drainage of cloudy fluid from the wound.  · Fever.  PREVENTION  Before the procedure:  · Tell your caregiver about any medical problems you may have. Health problems such as allergies, diabetes, and obesity could affect your surgery and treatment.  · Quit smoking. Patients who smoke get more infections. Talk to your caregiver about how you can quit before your surgery.  · Do not shave near the area where you will have surgery. Shaving with a razor can irritate your skin and make it easier to get an infection. Your caregiver may use an electric clipper to remove some of your hair immediately before surgery.  · Ask if you will get antibiotic medicine. In most cases, you should get antibiotics within 60 minutes before surgery. Antibiotics should be stopped within 24 hours after surgery.  · Your caregivers will clean their hands and arms up to their elbows with an antiseptic agent just before the surgery. Your caregivers will also clean their hands with soap and water or an alcohol-based hand rub before and after caring for each patient.  · Your caregivers will wear hair covers, masks, gowns, and gloves during surgery to keep the surgery area clean.  · Your caregivers will clean the skin at the surgery site with a soap that kills germs.  After the procedure:  · Make sure your caregivers clean their hands before examining you. They may use soap and water or an alcohol-based hand rub. If you do not see your caregivers clean their hands, ask them to do so.  · Make sure family and friends who visit you do not touch the surgical wound or dressings.  · Ask family and friends to clean their hands with soap and water or an alcohol-based hand rub before and after visiting  you.  · Make sure you know how to care for your wound before you leave the hospital. Your caregiver will tell you how to take care of your wound.  · Make sure you know whom to contact if you have questions or problems after you get home.  TREATMENT  Most surgical site infections can be treated with antibiotics. Sometimes, patients need another surgery to treat the infection.  HOME CARE INSTRUCTIONS  Always clean your hands before and after caring for your wound.  SEEK IMMEDIATE MEDICAL CARE IF:  You have any symptoms of an infection, such as drainage, fever, or redness and pain at the surgery site.  Document Released: 01/04/2011 Document Revised: 01/01/2012 Document Reviewed: 01/04/2011  [employer]® Patient Information ©2014 [employer].

## 2013-10-24 NOTE — MAU Note (Signed)
Baby had check up today, is doing well.

## 2013-10-24 NOTE — MAU Note (Signed)
Surgery 12/22.   Incision on left side is open with a lot of drainage.

## 2013-10-28 NOTE — Discharge Summary (Signed)
Obstetric Discharge Summary Reason for Admission: onset of labor and rupture of membranes Prenatal Procedures: NST and ultrasound Intrapartum Procedures: cesarean: low cervical, transverse Postpartum Procedures: none Complications-Operative and Postpartum: none Hemoglobin  Date Value Range Status  10/24/2013 12.3  12.0 - 15.0 g/dL Final  6/96/29528/27/2013 84.115.1  12.2 - 16.2 g/dL Final     HCT  Date Value Range Status  10/24/2013 36.2  36.0 - 46.0 % Final     HCT, POC  Date Value Range Status  06/18/2012 46.9  37.7 - 47.9 % Final    Physical Exam:  General: alert, cooperative and appears stated age 34Lochia: appropriate Uterine Fundus: firm Incision: healing well DVT Evaluation: No evidence of DVT seen on physical exam.  Discharge Diagnoses: Term Pregnancy-delivered, Gestational diabetes mellitus with poor compliance with care  Discharge Information: Date: 10/28/2013 Activity: pelvic rest Diet: routine Medications: Ibuprofen, Colace and Percocet Condition: improved Instructions: refer to practice specific booklet Discharge to: home   Newborn Data: Live born female  Birth Weight: 7 lb 15 oz (3600 g) APGAR: 8, 9  Home with mother.  Janice Case H. 10/28/2013, 12:06 PM

## 2014-08-18 ENCOUNTER — Encounter (HOSPITAL_COMMUNITY): Payer: Self-pay | Admitting: Emergency Medicine

## 2014-08-18 ENCOUNTER — Emergency Department (HOSPITAL_COMMUNITY)
Admission: EM | Admit: 2014-08-18 | Discharge: 2014-08-18 | Disposition: A | Discharge status: Home / Self Care | Payer: Managed Care, Other (non HMO) | Attending: Emergency Medicine | Admitting: Emergency Medicine

## 2014-08-18 DIAGNOSIS — E119 Type 2 diabetes mellitus without complications: Secondary | ICD-10-CM | POA: Insufficient documentation

## 2014-08-18 DIAGNOSIS — R51 Headache: Secondary | ICD-10-CM

## 2014-08-18 DIAGNOSIS — Y9389 Activity, other specified: Secondary | ICD-10-CM | POA: Insufficient documentation

## 2014-08-18 DIAGNOSIS — R519 Headache, unspecified: Secondary | ICD-10-CM

## 2014-08-18 DIAGNOSIS — Z792 Long term (current) use of antibiotics: Secondary | ICD-10-CM | POA: Diagnosis not present

## 2014-08-18 DIAGNOSIS — Y92410 Unspecified street and highway as the place of occurrence of the external cause: Secondary | ICD-10-CM | POA: Insufficient documentation

## 2014-08-18 DIAGNOSIS — Z8632 Personal history of gestational diabetes: Secondary | ICD-10-CM | POA: Insufficient documentation

## 2014-08-18 DIAGNOSIS — S0990XA Unspecified injury of head, initial encounter: Secondary | ICD-10-CM | POA: Diagnosis present

## 2014-08-18 DIAGNOSIS — Z79899 Other long term (current) drug therapy: Secondary | ICD-10-CM | POA: Insufficient documentation

## 2014-08-18 MED ORDER — HYDROCODONE-ACETAMINOPHEN 5-325 MG PO TABS
1.0000 | ORAL_TABLET | Freq: Once | ORAL | Status: AC
  Administered 2014-08-18: 1 via ORAL
  Filled 2014-08-18: qty 1

## 2014-08-18 MED ORDER — HYDROCODONE-ACETAMINOPHEN 5-325 MG PO TABS: 1.0000 | ORAL_TABLET | Freq: Four times a day (QID) | ORAL | Status: AC | PRN

## 2014-08-18 MED ORDER — IBUPROFEN 400 MG PO TABS: 400.0000 mg | ORAL_TABLET | Freq: Three times a day (TID) | ORAL | Status: AC

## 2014-08-18 MED ORDER — IBUPROFEN 200 MG PO TABS
400.0000 mg | ORAL_TABLET | Freq: Once | ORAL | Status: AC
  Administered 2014-08-18: 400 mg via ORAL
  Filled 2014-08-18: qty 2

## 2014-08-18 NOTE — ED Provider Notes (Signed)
Medical screening examination/treatment/procedure(s) were performed by non-physician practitioner and as supervising physician I was immediately available for consultation/collaboration.   EKG Interpretation None       Arby BarretteMarcy Corwin Kuiken, MD 08/18/14 2245
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
## 2014-08-20 ENCOUNTER — Encounter (HOSPITAL_COMMUNITY): Payer: Self-pay | Admitting: Emergency Medicine

## 2014-08-20 ENCOUNTER — Emergency Department (HOSPITAL_COMMUNITY)
Admission: EM | Admit: 2014-08-20 | Discharge: 2014-08-20 | Disposition: A | Discharge status: Home / Self Care | Payer: Managed Care, Other (non HMO) | Attending: Emergency Medicine | Admitting: Emergency Medicine

## 2014-08-20 DIAGNOSIS — Y92410 Unspecified street and highway as the place of occurrence of the external cause: Secondary | ICD-10-CM | POA: Diagnosis not present

## 2014-08-20 DIAGNOSIS — Z79899 Other long term (current) drug therapy: Secondary | ICD-10-CM | POA: Diagnosis not present

## 2014-08-20 DIAGNOSIS — Y9389 Activity, other specified: Secondary | ICD-10-CM | POA: Diagnosis not present

## 2014-08-20 DIAGNOSIS — G44319 Acute post-traumatic headache, not intractable: Secondary | ICD-10-CM

## 2014-08-20 DIAGNOSIS — S0990XA Unspecified injury of head, initial encounter: Secondary | ICD-10-CM | POA: Insufficient documentation

## 2014-08-20 DIAGNOSIS — Z8679 Personal history of other diseases of the circulatory system: Secondary | ICD-10-CM | POA: Insufficient documentation

## 2014-08-20 DIAGNOSIS — Z8632 Personal history of gestational diabetes: Secondary | ICD-10-CM | POA: Diagnosis not present

## 2014-08-20 DIAGNOSIS — Z792 Long term (current) use of antibiotics: Secondary | ICD-10-CM | POA: Insufficient documentation

## 2014-08-20 DIAGNOSIS — E119 Type 2 diabetes mellitus without complications: Secondary | ICD-10-CM | POA: Diagnosis not present

## 2014-08-20 MED ORDER — ACETAMINOPHEN 325 MG PO TABS
650.0000 mg | ORAL_TABLET | Freq: Once | ORAL | Status: AC
  Administered 2014-08-20: 650 mg via ORAL
  Filled 2014-08-20: qty 2

## 2014-08-20 MED ORDER — IBUPROFEN 200 MG PO TABS
400.0000 mg | ORAL_TABLET | Freq: Once | ORAL | Status: AC
  Administered 2014-08-20: 400 mg via ORAL
  Filled 2014-08-20: qty 2

## 2014-08-20 NOTE — Discharge Instructions (Signed)
Take tylenol and or motrin for headache. If you were given medicines take as directed.  If you are on coumadin or contraceptives realize their levels and effectiveness is altered by many different medicines.  If you have any reaction (rash, tongues swelling, other) to the medicines stop taking and see a physician.   Please follow up as directed and return to the ER or see a physician for new or worsening symptoms.  Thank you. Filed Vitals:   08/20/14 0814  BP: 113/72  Pulse: 77  Temp: 98.1 F (36.7 C)  TempSrc: Oral  Resp: 20  SpO2: 100%
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
## 2014-08-24 ENCOUNTER — Encounter (HOSPITAL_COMMUNITY): Payer: Self-pay | Admitting: Emergency Medicine

## 2016-11-28 ENCOUNTER — Other Ambulatory Visit: Payer: Self-pay | Admitting: Obstetrics and Gynecology

## 2016-11-30 LAB — CYTOLOGY - PAP

## 2019-01-01 ENCOUNTER — Emergency Department (HOSPITAL_COMMUNITY): Payer: 59

## 2019-01-01 ENCOUNTER — Emergency Department (HOSPITAL_COMMUNITY)
Admission: EM | Admit: 2019-01-01 | Discharge: 2019-01-01 | Disposition: A | Discharge status: Home / Self Care | Payer: 59 | Attending: Emergency Medicine | Admitting: Emergency Medicine

## 2019-01-01 ENCOUNTER — Encounter (HOSPITAL_COMMUNITY): Payer: Self-pay

## 2019-01-01 ENCOUNTER — Other Ambulatory Visit: Payer: Self-pay

## 2019-01-01 DIAGNOSIS — M542 Cervicalgia: Secondary | ICD-10-CM | POA: Insufficient documentation

## 2019-01-01 DIAGNOSIS — Y998 Other external cause status: Secondary | ICD-10-CM | POA: Diagnosis not present

## 2019-01-01 DIAGNOSIS — E119 Type 2 diabetes mellitus without complications: Secondary | ICD-10-CM | POA: Diagnosis not present

## 2019-01-01 DIAGNOSIS — Y92410 Unspecified street and highway as the place of occurrence of the external cause: Secondary | ICD-10-CM | POA: Diagnosis not present

## 2019-01-01 DIAGNOSIS — M25569 Pain in unspecified knee: Secondary | ICD-10-CM | POA: Insufficient documentation

## 2019-01-01 DIAGNOSIS — R51 Headache: Secondary | ICD-10-CM | POA: Diagnosis not present

## 2019-01-01 DIAGNOSIS — Y9389 Activity, other specified: Secondary | ICD-10-CM | POA: Insufficient documentation

## 2019-01-01 DIAGNOSIS — M79605 Pain in left leg: Secondary | ICD-10-CM | POA: Diagnosis present

## 2019-01-01 LAB — POC URINE PREG, ED: PREG TEST UR: NEGATIVE

## 2019-01-01 MED ORDER — CYCLOBENZAPRINE HCL 10 MG PO TABS: 5.0000 mg | ORAL_TABLET | Freq: Two times a day (BID) | ORAL | 0 refills | Status: AC | PRN

## 2019-01-01 MED ORDER — NAPROXEN 375 MG PO TABS: 375.0000 mg | ORAL_TABLET | Freq: Two times a day (BID) | ORAL | 0 refills | Status: AC

## 2019-01-01 NOTE — Discharge Instructions (Signed)
You had no visible injuries on your images today. Return to the emergency department immediately if you develop any of the following symptoms: You have numbness, tingling, or weakness in the arms or legs. You develop severe headaches not relieved with medicine. You have severe neck pain, especially tenderness in the middle of the back of your neck. You have changes in bowel or bladder control. There is increasing pain in any area of the body. You have shortness of breath, light-headedness, dizziness, or fainting. You have chest pain. You feel sick to your stomach (nauseous), throw up (vomit), or sweat. You have increasing abdominal discomfort. There is blood in your urine, stool, or vomit. You have pain in your shoulder (shoulder strap areas). You feel your symptoms are getting worse.
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
## 2019-01-01 NOTE — ED Notes (Signed)
Patient transported to X-ray 

## 2019-01-03 ENCOUNTER — Other Ambulatory Visit: Payer: Self-pay

## 2019-01-03 ENCOUNTER — Other Ambulatory Visit (HOSPITAL_COMMUNITY): Payer: Self-pay | Admitting: Chiropractic Medicine

## 2019-01-03 ENCOUNTER — Ambulatory Visit (HOSPITAL_COMMUNITY)
Admission: RE | Admit: 2019-01-03 | Discharge: 2019-01-03 | Disposition: A | Discharge status: Home / Self Care | Payer: 59 | Source: Ambulatory Visit | Attending: Chiropractic Medicine | Admitting: Chiropractic Medicine

## 2019-01-03 DIAGNOSIS — M25562 Pain in left knee: Secondary | ICD-10-CM | POA: Insufficient documentation

## 2019-01-03 DIAGNOSIS — M542 Cervicalgia: Secondary | ICD-10-CM | POA: Diagnosis present

## 2019-01-03 DIAGNOSIS — M549 Dorsalgia, unspecified: Secondary | ICD-10-CM

## 2020-02-18 IMAGING — DX LEFT KNEE - COMPLETE 4+ VIEW
4 series · 4 of 4 positions shown · non-contrast
Comparison: Radiographs January 01, 2019.

CLINICAL DATA: Left knee pain.

EXAM:
LEFT KNEE - COMPLETE 4+ VIEW

[knee ap]
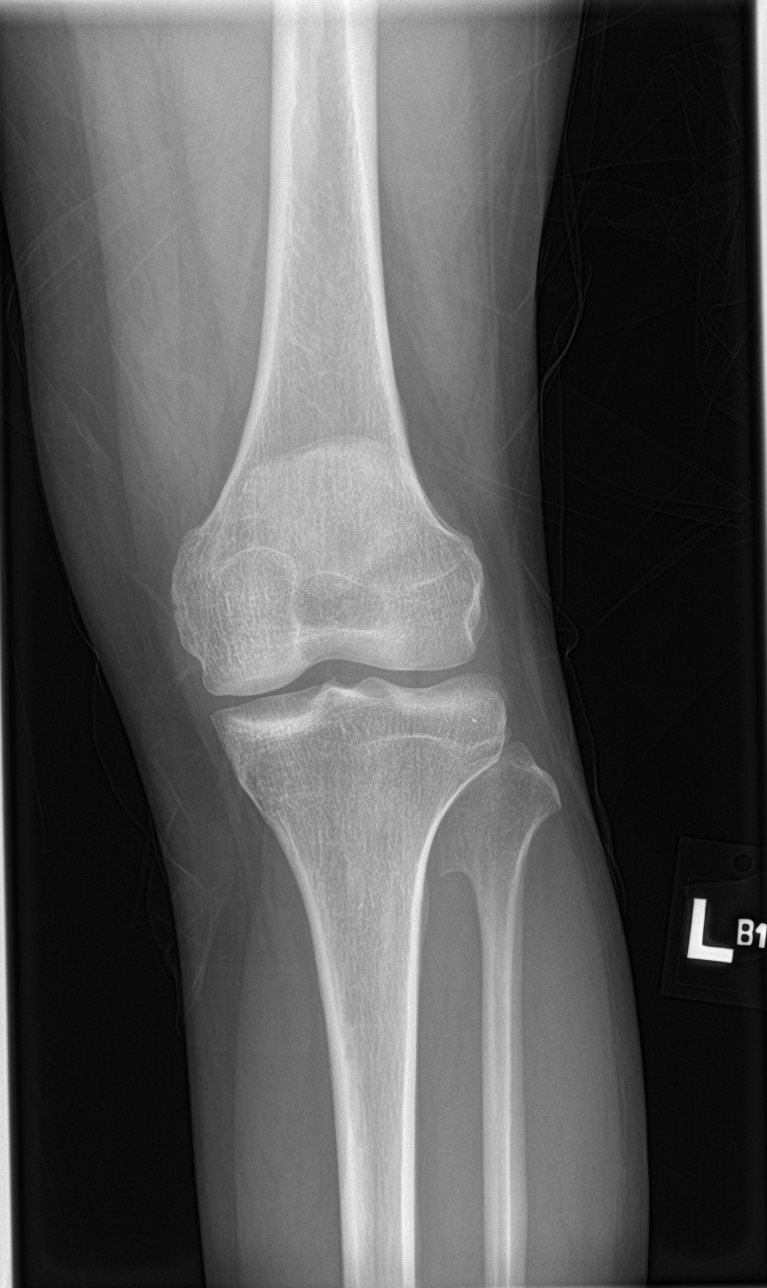

[knee lat]
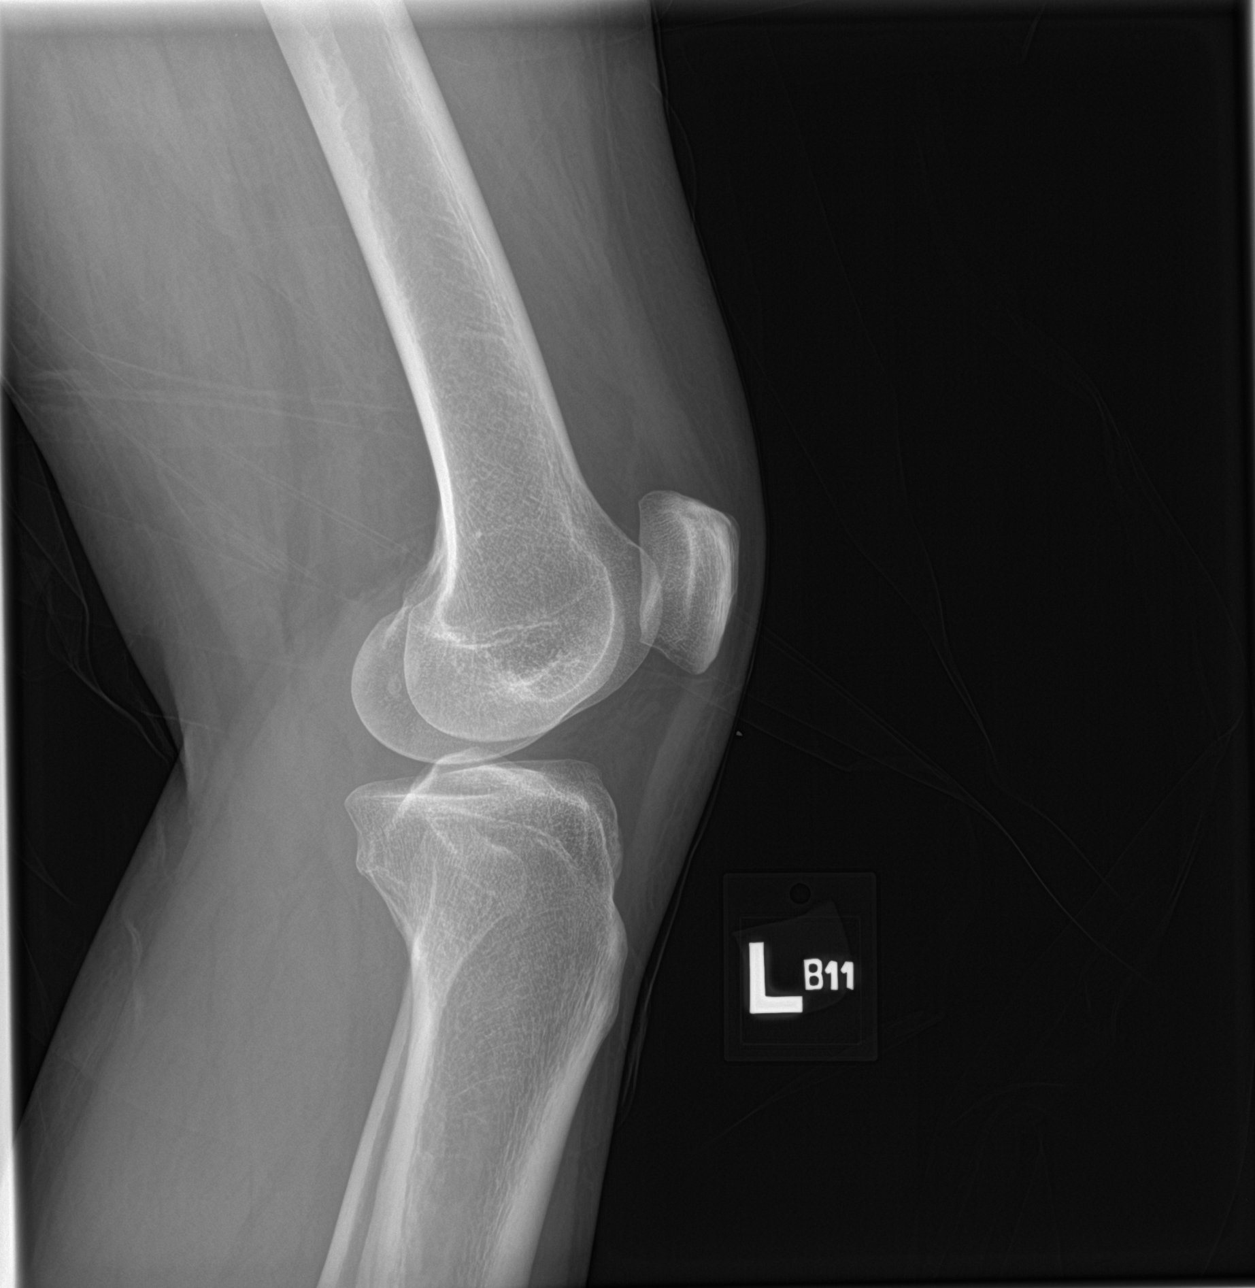

[knee obl (1 of 2)]
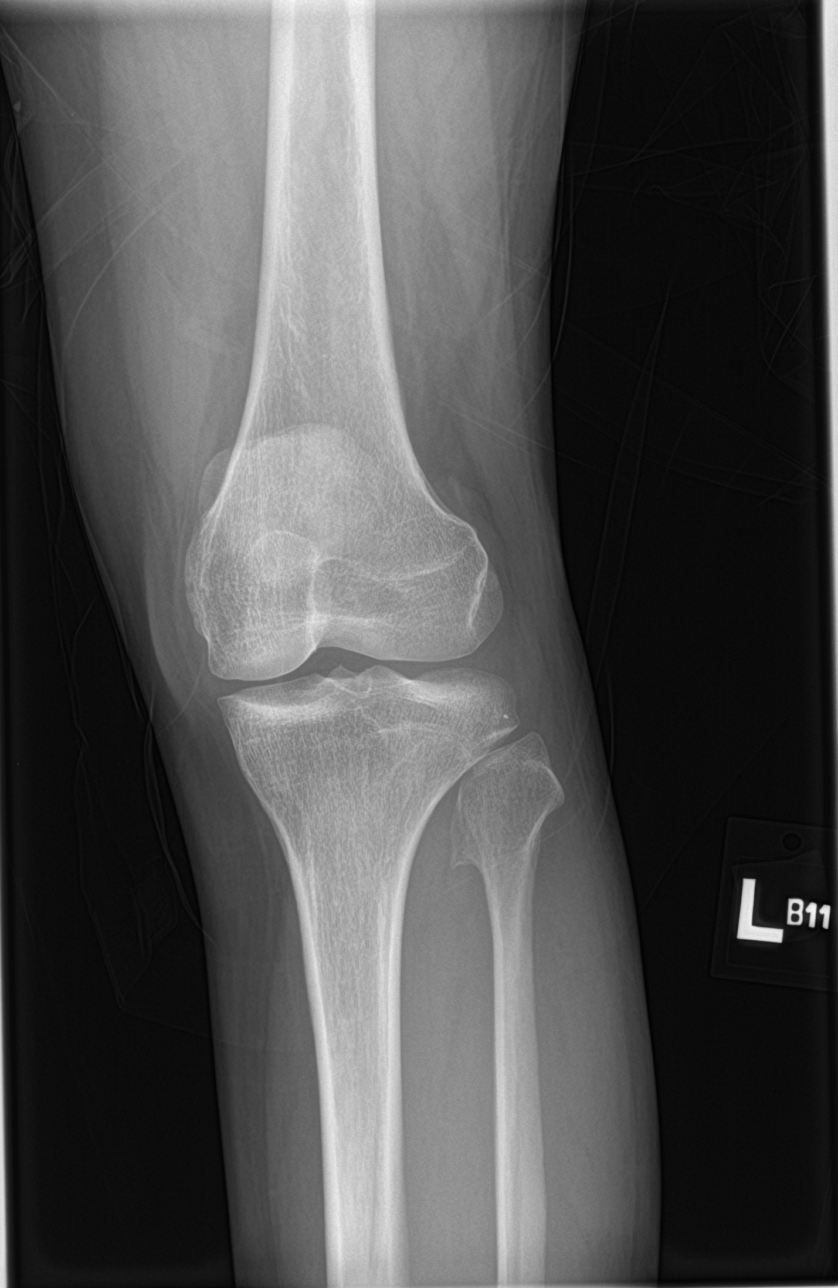

[knee obl (2 of 2)]
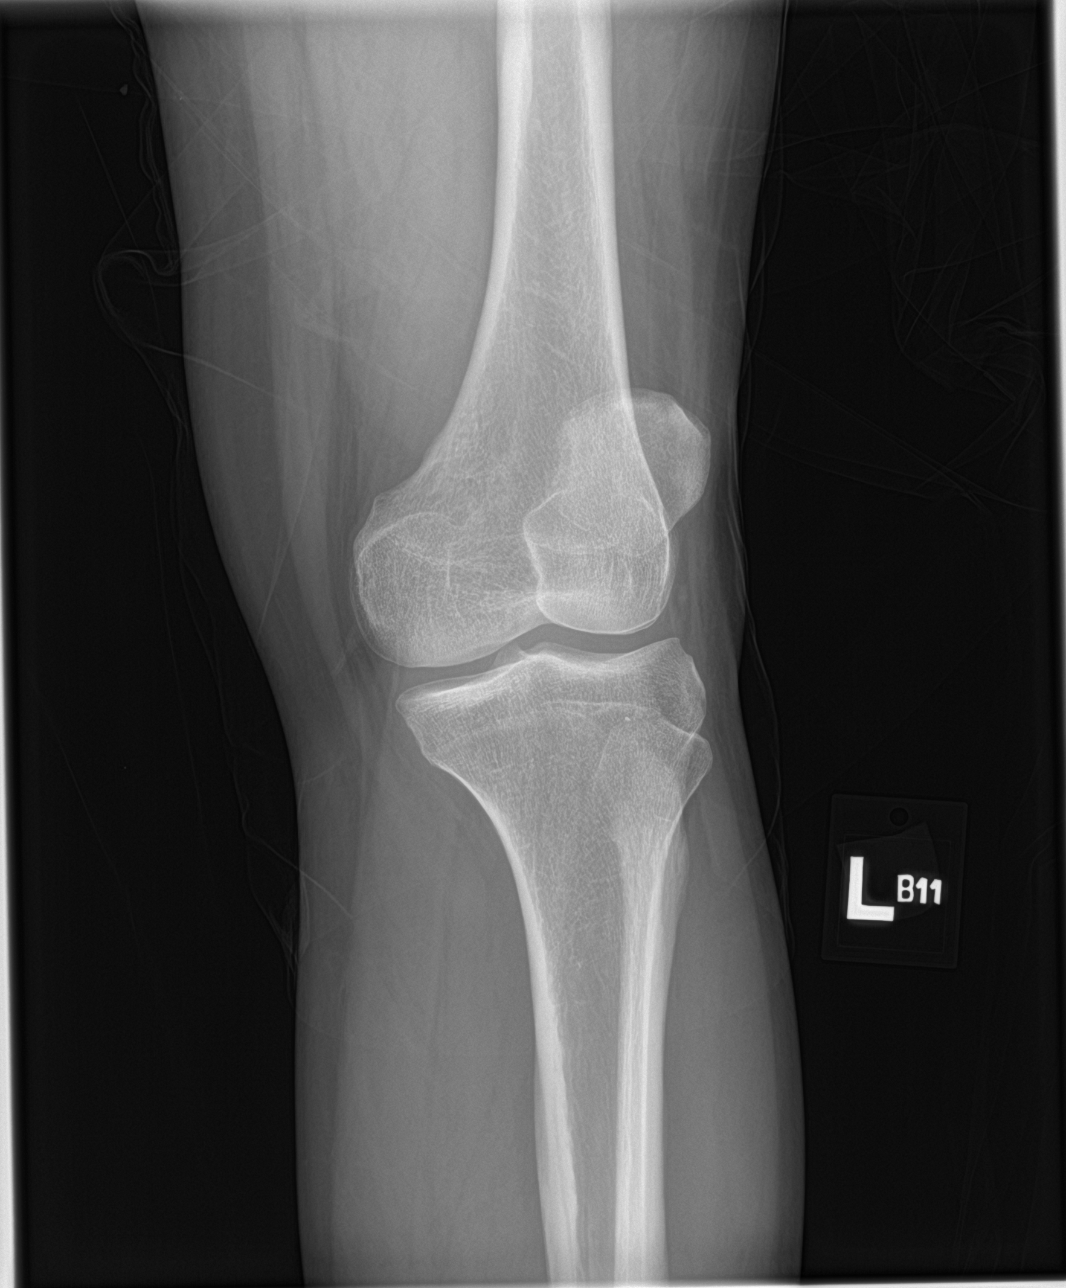

[4 of 4 positions shown; findings below may reference images not displayed]

FINDINGS: No evidence of fracture, dislocation, or joint effusion. No evidence
of arthropathy or other focal bone abnormality. Soft tissues are
unremarkable.
IMPRESSION: Negative.

## 2020-08-20 ENCOUNTER — Other Ambulatory Visit: Payer: Self-pay | Admitting: Family Medicine

## 2020-08-20 ENCOUNTER — Other Ambulatory Visit (HOSPITAL_COMMUNITY)
Admission: RE | Admit: 2020-08-20 | Discharge: 2020-08-20 | Disposition: A | Discharge status: Home / Self Care | Payer: 59 | Source: Ambulatory Visit | Attending: Family Medicine | Admitting: Family Medicine

## 2020-08-20 DIAGNOSIS — Z124 Encounter for screening for malignant neoplasm of cervix: Secondary | ICD-10-CM | POA: Diagnosis not present

## 2020-08-25 LAB — CYTOLOGY - PAP
Comment: NEGATIVE
Diagnosis: HIGH — AB
High risk HPV: NEGATIVE

## 2022-06-20 DIAGNOSIS — Z3042 Encounter for surveillance of injectable contraceptive: Secondary | ICD-10-CM | POA: Diagnosis not present

## 2022-09-05 DIAGNOSIS — Z3042 Encounter for surveillance of injectable contraceptive: Secondary | ICD-10-CM | POA: Diagnosis not present

## 2022-11-24 DIAGNOSIS — Z3042 Encounter for surveillance of injectable contraceptive: Secondary | ICD-10-CM | POA: Diagnosis not present

## 2023-01-12 DIAGNOSIS — K219 Gastro-esophageal reflux disease without esophagitis: Secondary | ICD-10-CM | POA: Diagnosis not present

## 2023-01-12 DIAGNOSIS — Z0000 Encounter for general adult medical examination without abnormal findings: Secondary | ICD-10-CM | POA: Diagnosis not present

## 2023-01-12 DIAGNOSIS — E1169 Type 2 diabetes mellitus with other specified complication: Secondary | ICD-10-CM | POA: Diagnosis not present

## 2023-01-12 DIAGNOSIS — E7800 Pure hypercholesterolemia, unspecified: Secondary | ICD-10-CM | POA: Diagnosis not present

## 2023-01-12 DIAGNOSIS — I10 Essential (primary) hypertension: Secondary | ICD-10-CM | POA: Diagnosis not present

## 2023-01-15 ENCOUNTER — Other Ambulatory Visit: Payer: Self-pay | Admitting: Family Medicine

## 2023-01-15 DIAGNOSIS — E010 Iodine-deficiency related diffuse (endemic) goiter: Secondary | ICD-10-CM

## 2023-02-23 DIAGNOSIS — Z3042 Encounter for surveillance of injectable contraceptive: Secondary | ICD-10-CM | POA: Diagnosis not present

## 2023-02-27 ENCOUNTER — Ambulatory Visit
Admission: RE | Admit: 2023-02-27 | Discharge: 2023-02-27 | Disposition: A | Discharge status: Home / Self Care | Payer: BC Managed Care – PPO | Source: Ambulatory Visit | Attending: Family Medicine | Admitting: Family Medicine

## 2023-02-27 DIAGNOSIS — E049 Nontoxic goiter, unspecified: Secondary | ICD-10-CM | POA: Diagnosis not present

## 2023-02-27 DIAGNOSIS — E010 Iodine-deficiency related diffuse (endemic) goiter: Secondary | ICD-10-CM

## 2023-04-04 DIAGNOSIS — Z124 Encounter for screening for malignant neoplasm of cervix: Secondary | ICD-10-CM | POA: Diagnosis not present

## 2023-04-04 DIAGNOSIS — Z1151 Encounter for screening for human papillomavirus (HPV): Secondary | ICD-10-CM | POA: Diagnosis not present

## 2023-04-04 DIAGNOSIS — Z01419 Encounter for gynecological examination (general) (routine) without abnormal findings: Secondary | ICD-10-CM | POA: Diagnosis not present

## 2023-04-13 DIAGNOSIS — I10 Essential (primary) hypertension: Secondary | ICD-10-CM | POA: Diagnosis not present

## 2023-04-13 DIAGNOSIS — E1169 Type 2 diabetes mellitus with other specified complication: Secondary | ICD-10-CM | POA: Diagnosis not present

## 2023-04-13 DIAGNOSIS — E7800 Pure hypercholesterolemia, unspecified: Secondary | ICD-10-CM | POA: Diagnosis not present

## 2023-04-13 DIAGNOSIS — F329 Major depressive disorder, single episode, unspecified: Secondary | ICD-10-CM | POA: Diagnosis not present

## 2023-05-18 DIAGNOSIS — Z3042 Encounter for surveillance of injectable contraceptive: Secondary | ICD-10-CM | POA: Diagnosis not present

## 2023-08-10 DIAGNOSIS — Z3042 Encounter for surveillance of injectable contraceptive: Secondary | ICD-10-CM | POA: Diagnosis not present

## 2023-08-10 DIAGNOSIS — Z8719 Personal history of other diseases of the digestive system: Secondary | ICD-10-CM | POA: Diagnosis not present

## 2023-08-10 DIAGNOSIS — N941 Unspecified dyspareunia: Secondary | ICD-10-CM | POA: Diagnosis not present

## 2023-08-20 DIAGNOSIS — N941 Unspecified dyspareunia: Secondary | ICD-10-CM | POA: Diagnosis not present

## 2023-08-20 DIAGNOSIS — N952 Postmenopausal atrophic vaginitis: Secondary | ICD-10-CM | POA: Diagnosis not present

## 2023-08-20 DIAGNOSIS — Z3200 Encounter for pregnancy test, result unknown: Secondary | ICD-10-CM | POA: Diagnosis not present

## 2023-08-20 DIAGNOSIS — Z1231 Encounter for screening mammogram for malignant neoplasm of breast: Secondary | ICD-10-CM | POA: Diagnosis not present

## 2023-09-17 DIAGNOSIS — K649 Unspecified hemorrhoids: Secondary | ICD-10-CM | POA: Diagnosis not present

## 2023-09-17 DIAGNOSIS — N952 Postmenopausal atrophic vaginitis: Secondary | ICD-10-CM | POA: Diagnosis not present

## 2024-01-15 DIAGNOSIS — E010 Iodine-deficiency related diffuse (endemic) goiter: Secondary | ICD-10-CM | POA: Diagnosis not present

## 2024-01-15 DIAGNOSIS — Z309 Encounter for contraceptive management, unspecified: Secondary | ICD-10-CM | POA: Diagnosis not present

## 2024-01-15 DIAGNOSIS — Z3042 Encounter for surveillance of injectable contraceptive: Secondary | ICD-10-CM | POA: Diagnosis not present

## 2024-01-15 DIAGNOSIS — I10 Essential (primary) hypertension: Secondary | ICD-10-CM | POA: Diagnosis not present

## 2024-01-15 DIAGNOSIS — E7800 Pure hypercholesterolemia, unspecified: Secondary | ICD-10-CM | POA: Diagnosis not present

## 2024-01-15 DIAGNOSIS — Z0000 Encounter for general adult medical examination without abnormal findings: Secondary | ICD-10-CM | POA: Diagnosis not present

## 2024-01-15 DIAGNOSIS — E1169 Type 2 diabetes mellitus with other specified complication: Secondary | ICD-10-CM | POA: Diagnosis not present

## 2024-04-08 DIAGNOSIS — Z1151 Encounter for screening for human papillomavirus (HPV): Secondary | ICD-10-CM | POA: Diagnosis not present

## 2024-04-08 DIAGNOSIS — Z01419 Encounter for gynecological examination (general) (routine) without abnormal findings: Secondary | ICD-10-CM | POA: Diagnosis not present

## 2024-04-08 DIAGNOSIS — Z124 Encounter for screening for malignant neoplasm of cervix: Secondary | ICD-10-CM | POA: Diagnosis not present

## 2024-04-17 DIAGNOSIS — Z309 Encounter for contraceptive management, unspecified: Secondary | ICD-10-CM | POA: Diagnosis not present

## 2024-04-17 DIAGNOSIS — Z3042 Encounter for surveillance of injectable contraceptive: Secondary | ICD-10-CM | POA: Diagnosis not present

## 2024-04-17 DIAGNOSIS — E1169 Type 2 diabetes mellitus with other specified complication: Secondary | ICD-10-CM | POA: Diagnosis not present

## 2024-04-17 DIAGNOSIS — I10 Essential (primary) hypertension: Secondary | ICD-10-CM | POA: Diagnosis not present

## 2024-04-17 DIAGNOSIS — E7800 Pure hypercholesterolemia, unspecified: Secondary | ICD-10-CM | POA: Diagnosis not present

## 2024-04-17 DIAGNOSIS — K219 Gastro-esophageal reflux disease without esophagitis: Secondary | ICD-10-CM | POA: Diagnosis not present

## 2024-07-11 DIAGNOSIS — Z3042 Encounter for surveillance of injectable contraceptive: Secondary | ICD-10-CM | POA: Diagnosis not present

## 2024-07-22 DIAGNOSIS — K219 Gastro-esophageal reflux disease without esophagitis: Secondary | ICD-10-CM | POA: Diagnosis not present

## 2024-07-22 DIAGNOSIS — E7800 Pure hypercholesterolemia, unspecified: Secondary | ICD-10-CM | POA: Diagnosis not present

## 2024-07-22 DIAGNOSIS — E1169 Type 2 diabetes mellitus with other specified complication: Secondary | ICD-10-CM | POA: Diagnosis not present

## 2024-07-22 DIAGNOSIS — I10 Essential (primary) hypertension: Secondary | ICD-10-CM | POA: Diagnosis not present

## 2024-08-19 ENCOUNTER — Other Ambulatory Visit: Payer: Self-pay | Admitting: Family Medicine

## 2024-08-19 DIAGNOSIS — E010 Iodine-deficiency related diffuse (endemic) goiter: Secondary | ICD-10-CM

## 2024-10-21 ENCOUNTER — Other Ambulatory Visit: Payer: Self-pay | Admitting: Family Medicine

## 2024-10-21 DIAGNOSIS — E041 Nontoxic single thyroid nodule: Secondary | ICD-10-CM

## 2024-10-30 ENCOUNTER — Ambulatory Visit
Admission: RE | Admit: 2024-10-30 | Discharge: 2024-10-30 | Disposition: A | Discharge status: Home / Self Care | Source: Ambulatory Visit | Attending: Family Medicine | Admitting: Family Medicine

## 2024-10-30 DIAGNOSIS — E041 Nontoxic single thyroid nodule: Secondary | ICD-10-CM

## 2024-11-18 ENCOUNTER — Other Ambulatory Visit: Payer: Self-pay | Admitting: Family Medicine

## 2024-11-18 ENCOUNTER — Encounter: Payer: Self-pay | Admitting: Physician Assistant

## 2024-11-18 DIAGNOSIS — E041 Nontoxic single thyroid nodule: Secondary | ICD-10-CM
